# Patient Record
Sex: Female | Born: 1965 | Race: Black or African American | Hispanic: No | Smoking: Never smoker
Health system: Southern US, Community
[De-identification: ages and names within clinical notes are randomized; demographics above are authoritative.]

## PROBLEM LIST (undated history)

## (undated) DIAGNOSIS — R7303 Prediabetes: Secondary | ICD-10-CM

## (undated) DIAGNOSIS — E7800 Pure hypercholesterolemia, unspecified: Secondary | ICD-10-CM

## (undated) DIAGNOSIS — I10 Essential (primary) hypertension: Secondary | ICD-10-CM

## (undated) DIAGNOSIS — E119 Type 2 diabetes mellitus without complications: Secondary | ICD-10-CM

---

## 2016-03-09 ENCOUNTER — Emergency Department
Admission: EM | Admit: 2016-03-09 | Discharge: 2016-03-10 | Disposition: A | Discharge status: Home / Self Care | Payer: Self-pay | Attending: Emergency Medicine | Admitting: Emergency Medicine

## 2016-03-09 ENCOUNTER — Emergency Department: Payer: Self-pay

## 2016-03-09 ENCOUNTER — Encounter: Payer: Self-pay | Admitting: Emergency Medicine

## 2016-03-09 DIAGNOSIS — R1032 Left lower quadrant pain: Secondary | ICD-10-CM

## 2016-03-09 DIAGNOSIS — E876 Hypokalemia: Secondary | ICD-10-CM

## 2016-03-09 DIAGNOSIS — D259 Leiomyoma of uterus, unspecified: Secondary | ICD-10-CM

## 2016-03-09 LAB — COMPREHENSIVE METABOLIC PANEL
ALK PHOS: 40 U/L (ref 38–126)
ALT: 23 U/L (ref 14–54)
AST: 20 U/L (ref 15–41)
Albumin: 4.1 g/dL (ref 3.5–5.0)
Anion gap: 7 (ref 5–15)
BUN: 17 mg/dL (ref 6–20)
CHLORIDE: 102 mmol/L (ref 101–111)
CO2: 29 mmol/L (ref 22–32)
CREATININE: 0.77 mg/dL (ref 0.44–1.00)
Calcium: 9.1 mg/dL (ref 8.9–10.3)
GFR calc Af Amer: >60 mL/min (ref >60)
Glucose, Bld: 140 mg/dL — ABNORMAL HIGH (ref 65–99)
Potassium: 3.1 mmol/L — ABNORMAL LOW (ref 3.5–5.1)
Sodium: 138 mmol/L (ref 135–145)
Total Bilirubin: 0.2 mg/dL — ABNORMAL LOW (ref 0.3–1.2)
Total Protein: 7.6 g/dL (ref 6.5–8.1)

## 2016-03-09 LAB — URINALYSIS, COMPLETE (UACMP) WITH MICROSCOPIC
Bacteria, UA: NONE SEEN
Bilirubin Urine: NEGATIVE
Glucose, UA: NEGATIVE mg/dL
Hgb urine dipstick: NEGATIVE
Ketones, ur: NEGATIVE mg/dL
LEUKOCYTES UA: NEGATIVE
Nitrite: NEGATIVE
PH: 7 (ref 5.0–8.0)
Protein, ur: NEGATIVE mg/dL
SPECIFIC GRAVITY, URINE: 1.015 (ref 1.005–1.030)

## 2016-03-09 LAB — CBC
HCT: 35.4 % (ref 35.0–47.0)
Hemoglobin: 12.6 g/dL (ref 12.0–16.0)
MCH: 29.7 pg (ref 26.0–34.0)
MCHC: 35.5 g/dL (ref 32.0–36.0)
MCV: 83.7 fL (ref 80.0–100.0)
PLATELETS: 330 10*3/uL (ref 150–440)
RBC: 4.23 MIL/uL (ref 3.80–5.20)
RDW: 13.1 % (ref 11.5–14.5)
WBC: 9.8 10*3/uL (ref 3.6–11.0)

## 2016-03-09 LAB — LIPASE, BLOOD: LIPASE: 25 U/L (ref 11–51)

## 2016-03-09 MED ORDER — ONDANSETRON 4 MG PO TBDP
4.0000 mg | ORAL_TABLET | Freq: Once | ORAL | Status: AC
  Administered 2016-03-09: 4 mg via ORAL
  Filled 2016-03-09: qty 1

## 2016-03-09 MED ORDER — KETOROLAC TROMETHAMINE 60 MG/2ML IM SOLN
60.0000 mg | Freq: Once | INTRAMUSCULAR | Status: AC
  Administered 2016-03-09: 60 mg via INTRAMUSCULAR
  Filled 2016-03-09: qty 2

## 2016-03-09 MED ORDER — POTASSIUM CHLORIDE CRYS ER 20 MEQ PO TBCR
40.0000 meq | EXTENDED_RELEASE_TABLET | Freq: Once | ORAL | Status: AC
  Administered 2016-03-09: 40 meq via ORAL
  Filled 2016-03-09: qty 2

## 2016-03-09 MED ORDER — HYDROCODONE-ACETAMINOPHEN 5-325 MG PO TABS
1.0000 | ORAL_TABLET | Freq: Once | ORAL | Status: AC
  Administered 2016-03-09: 1 via ORAL
  Filled 2016-03-09: qty 1

## 2016-03-09 NOTE — ED Provider Notes (Signed)
[facility] Emergency Department Provider Note   ____________________________________________   First MD Initiated Contact with Patient 03/09/16 2321     (approximate)  I have reviewed the triage vital signs and the nursing notes.   HISTORY  Chief Complaint Abdominal Pain    HPI Chloe Alvarado is a 50 y.o. female who presents to the ED from home with a chief complaint of abdominal pain. Patient reports onset of left lower quadrant abdominal pain 2 days ago. Felt like she was constipated from the big meal and took a laxative with good relief of her symptoms. Pain returned yesterday morning. Describes pain as sharp, nonradiating and not associated with fever, chills, chest pain, shortness of breath, nausea, vomiting. Complains of some dysuria. Denies recent travel, trauma or antibiotic use. Nothing makes her symptoms better or worse. No history of pelvic pathology. Denies vaginal bleeding or discharge. History of kidney stones but states this does not feel similar.   Past medical history Kidney stones  There are no active problems to display for this patient.   Past Surgical History:  Procedure Laterality Date  . CESAREAN SECTION      Prior to Admission medications   Not on File    Allergies Patient has no known allergies.  Family history Aunt with ovarian cysts  Social History Social History  Substance Use Topics  . Smoking status: Never Smoker  . Smokeless tobacco: Never Used  . Alcohol use No    Review of Systems  Constitutional: No fever/chills. Eyes: No visual changes. ENT: No sore throat. Cardiovascular: Denies chest pain. Respiratory: Denies shortness of breath. Gastrointestinal: Positive for abdominal pain.  No nausea, no vomiting.  No diarrhea.  No constipation. Genitourinary: Positive for dysuria. Musculoskeletal: Negative for back pain. Skin: Negative for rash. Neurological: Negative for headaches, focal weakness or  numbness.  10-point ROS otherwise negative.  ____________________________________________   PHYSICAL EXAM:  VITAL SIGNS: ED Triage Vitals  Enc Vitals Group     BP 03/09/16 2106 137/90     Pulse Rate 03/09/16 2106 76     Resp 03/09/16 2106 20     Temp 03/09/16 2106 98 F (36.7 C)     Temp Source 03/09/16 2106 Oral     SpO2 03/09/16 2106 100 %     Weight 03/09/16 2103 223 lb 5.2 oz (101.3 kg)     Height 03/09/16 2103 5\' 3"  (1.6 m)     Head Circumference --      Peak Flow --      Pain Score 03/09/16 2103 9     Pain Loc --      Pain Edu? --      Excl. in [place]? --     Constitutional: Alert and oriented. Well appearing and in no acute distress. Eyes: Conjunctivae are normal. PERRL. EOMI. Head: Atraumatic. Nose: No congestion/rhinnorhea. Mouth/Throat: Mucous membranes are moist.  Oropharynx non-erythematous. Neck: No stridor.   Cardiovascular: Normal rate, regular rhythm. Grossly normal heart sounds.  Good peripheral circulation. Respiratory: Normal respiratory effort.  No retractions. Lungs CTAB. Gastrointestinal: Obese. Soft and mildly tender to palpation left lower quadrant and suprapubic area without rebound or guarding. No distention. No abdominal bruits. No CVA tenderness. Musculoskeletal: No lower extremity tenderness nor edema.  No joint effusions. Neurologic:  Normal speech and language. No gross focal neurologic deficits are appreciated. No gait instability. Skin:  Skin is warm, dry and intact. No rash noted. Psychiatric: Mood and affect are normal. Speech and behavior are  normal.  ____________________________________________   LABS (all labs ordered are listed, but only abnormal results are displayed)  Labs Reviewed  COMPREHENSIVE METABOLIC PANEL - Abnormal; Notable for the following:       Result Value   Potassium 3.1 (*)    Glucose, Bld 140 (*)    Total Bilirubin 0.2 (*)    All other components within normal limits  URINALYSIS, COMPLETE (UACMP) WITH  MICROSCOPIC - Abnormal; Notable for the following:    Color, Urine YELLOW (*)    APPearance CLEAR (*)    Squamous Epithelial / LPF 0-5 (*)    All other components within normal limits  LIPASE, BLOOD  CBC  POC URINE PREG, ED   ____________________________________________  EKG  None ____________________________________________  RADIOLOGY  CT renal stone study interpreted per Dr. Alroy Dust: 1. Uncomplicated colonic diverticulosis.  2. Probable uterine fibroid in the anterior midline.  3. Small fat containing umbilical hernia.   ____________________________________________   PROCEDURES  Procedure(s) performed: None  Procedures  Critical Care performed: No  ____________________________________________   INITIAL IMPRESSION / ASSESSMENT AND PLAN / ED COURSE  Pertinent labs & imaging results that were available during my care of the patient were reviewed by me and considered in my medical decision making (see chart for details).  50 year old female who presents with left lower quadrant abdominal pain x 2 days. Laboratory and urinalysis results are unremarkable except for mild hypokalemia. Differential includes diverticulitis, kidney stone, ovarian torsion less likely. Will administer analgesia and proceed with CT renal colic study.  Clinical Course as of Mar 10 101  Tue Mar 10, 2016  0100 Patient improved. Updated her of CT imaging results. She will follow-up with GYN for further evaluation of fibroids. Strict return precautions given. Patient verbalizes understanding and agrees with plan of care.  [JS]    Clinical Course User Index [JS] Paulette Blanch, MD     ____________________________________________   FINAL CLINICAL IMPRESSION(S) / ED DIAGNOSES  Final diagnoses:  Left lower quadrant pain  Hypokalemia  Uterine leiomyoma, unspecified location      NEW MEDICATIONS STARTED DURING THIS VISIT:  New Prescriptions   No medications on file     Note:  This  document was prepared using Dragon voice recognition software and may include unintentional dictation errors.    Paulette Blanch, MD 03/10/16 [phone]

## 2016-03-09 NOTE — ED Triage Notes (Signed)
Pt to triage via w/c with no distress noted; pt reports left lower abd pain since yesterday with no accomp symptoms; denies hx of same

## 2016-03-10 MED ORDER — HYDROCODONE-ACETAMINOPHEN 5-325 MG PO TABS: 1.0000 | ORAL_TABLET | Freq: Four times a day (QID) | ORAL | 0 refills | Status: DC | PRN

## 2016-03-10 MED ORDER — IBUPROFEN 800 MG PO TABS: 800.0000 mg | ORAL_TABLET | Freq: Three times a day (TID) | ORAL | 0 refills | Status: AC | PRN

## 2016-03-10 NOTE — Discharge Instructions (Signed)
1. You may take pain medicines as needed (Motrin/Norco #15) disease. 2. Return to the ER for worsening symptoms, persistent vomiting, difficulty breathing or other concerns.

## 2016-03-20 ENCOUNTER — Other Ambulatory Visit: Payer: Self-pay | Admitting: Obstetrics and Gynecology

## 2016-03-20 DIAGNOSIS — R1032 Left lower quadrant pain: Secondary | ICD-10-CM

## 2016-03-24 ENCOUNTER — Ambulatory Visit
Admission: RE | Admit: 2016-03-24 | Discharge: 2016-03-24 | Disposition: A | Discharge status: Home / Self Care | Payer: Self-pay | Source: Ambulatory Visit | Attending: Obstetrics and Gynecology | Admitting: Obstetrics and Gynecology

## 2016-03-24 DIAGNOSIS — R1032 Left lower quadrant pain: Secondary | ICD-10-CM

## 2016-03-24 DIAGNOSIS — D259 Leiomyoma of uterus, unspecified: Secondary | ICD-10-CM | POA: Insufficient documentation

## 2016-05-24 ENCOUNTER — Encounter: Payer: Self-pay | Admitting: Emergency Medicine

## 2016-05-24 ENCOUNTER — Inpatient Hospital Stay
Admission: EM | Admit: 2016-05-24 | Discharge: 2016-05-26 | DRG: 638 | Disposition: A | Discharge status: Home / Self Care | Payer: Self-pay | Attending: Internal Medicine | Admitting: Internal Medicine

## 2016-05-24 DIAGNOSIS — E1165 Type 2 diabetes mellitus with hyperglycemia: Principal | ICD-10-CM | POA: Diagnosis present

## 2016-05-24 DIAGNOSIS — Z79899 Other long term (current) drug therapy: Secondary | ICD-10-CM

## 2016-05-24 DIAGNOSIS — R739 Hyperglycemia, unspecified: Secondary | ICD-10-CM | POA: Diagnosis present

## 2016-05-24 DIAGNOSIS — Z8249 Family history of ischemic heart disease and other diseases of the circulatory system: Secondary | ICD-10-CM

## 2016-05-24 DIAGNOSIS — E785 Hyperlipidemia, unspecified: Secondary | ICD-10-CM | POA: Diagnosis present

## 2016-05-24 DIAGNOSIS — E860 Dehydration: Secondary | ICD-10-CM | POA: Diagnosis present

## 2016-05-24 DIAGNOSIS — I10 Essential (primary) hypertension: Secondary | ICD-10-CM | POA: Diagnosis present

## 2016-05-24 DIAGNOSIS — E7800 Pure hypercholesterolemia, unspecified: Secondary | ICD-10-CM | POA: Diagnosis present

## 2016-05-24 DIAGNOSIS — E871 Hypo-osmolality and hyponatremia: Secondary | ICD-10-CM | POA: Diagnosis present

## 2016-05-24 HISTORY — DX: Pure hypercholesterolemia, unspecified: E78.00

## 2016-05-24 HISTORY — DX: Essential (primary) hypertension: I10

## 2016-05-24 HISTORY — DX: Prediabetes: R73.03

## 2016-05-24 LAB — CBC
HEMATOCRIT: 39.6 % (ref 35.0–47.0)
HEMOGLOBIN: 14.3 g/dL (ref 12.0–16.0)
MCH: 30.4 pg (ref 26.0–34.0)
MCHC: 36.1 g/dL — AB (ref 32.0–36.0)
MCV: 84.3 fL (ref 80.0–100.0)
Platelets: 338 10*3/uL (ref 150–440)
RBC: 4.7 MIL/uL (ref 3.80–5.20)
RDW: 13.2 % (ref 11.5–14.5)
WBC: 8.6 10*3/uL (ref 3.6–11.0)

## 2016-05-24 LAB — BASIC METABOLIC PANEL
ANION GAP: 15 (ref 5–15)
BUN: 26 mg/dL — AB (ref 6–20)
CHLORIDE: 89 mmol/L — AB (ref 101–111)
CO2: 24 mmol/L (ref 22–32)
Calcium: 10 mg/dL (ref 8.9–10.3)
Creatinine, Ser: 1.16 mg/dL — ABNORMAL HIGH (ref 0.44–1.00)
GFR calc Af Amer: >60 mL/min (ref >60)
GFR, EST NON AFRICAN AMERICAN: 54 mL/min — AB (ref >60)
GLUCOSE: 772 mg/dL — AB (ref 65–99)
Potassium: 4.1 mmol/L (ref 3.5–5.1)
Sodium: 128 mmol/L — ABNORMAL LOW (ref 135–145)

## 2016-05-24 LAB — GLUCOSE, CAPILLARY
GLUCOSE-CAPILLARY: 547 mg/dL — AB (ref 65–99)
GLUCOSE-CAPILLARY: 497 mg/dL — AB (ref 65–99)
GLUCOSE-CAPILLARY: 402 mg/dL — AB (ref 65–99)
Glucose-Capillary: >600 mg/dL (ref 65–99)

## 2016-05-24 MED ORDER — INSULIN ASPART 100 UNIT/ML ~~LOC~~ SOLN
10.0000 [IU] | Freq: Once | SUBCUTANEOUS | Status: AC
  Administered 2016-05-24: 10 [IU] via INTRAVENOUS
  Filled 2016-05-24: qty 10

## 2016-05-24 MED ORDER — SODIUM CHLORIDE 0.9 % IV SOLN
Freq: Once | INTRAVENOUS | Status: AC
  Administered 2016-05-24: via INTRAVENOUS

## 2016-05-24 MED ORDER — SODIUM CHLORIDE 0.9 % IV BOLUS (SEPSIS)
1000.0000 mL | Freq: Once | INTRAVENOUS | Status: AC
  Administered 2016-05-24: 1000 mL via INTRAVENOUS

## 2016-05-24 NOTE — ED Provider Notes (Signed)
[facility] Emergency Department Provider Note   ____________________________________________   I have reviewed the triage vital signs and the nursing notes.   HISTORY  Chief Complaint Weakness and Dental Pain   History limited by: Not Limited   HPI Chloe Alvarado is a 51 y.o. female who presents to the emergency department today because of concerns for weakness and some vision changes. Patient states that the symptoms are getting worse for the past couple of weeks but have been particularly bad the past 3 days. The patient states that she has a history of borderline diabetes but has never been treated for. She does have family history of diabetes.   Past Medical History:  Diagnosis Date  . Borderline diabetic   . High cholesterol   . Hypertension     There are no active problems to display for this patient.   Past Surgical History:  Procedure Laterality Date  . CESAREAN SECTION      Prior to Admission medications   Medication Sig Start Date End Date Taking? Authorizing Provider  HYDROcodone-acetaminophen (NORCO) 5-325 MG tablet Take 1 tablet by mouth every 6 (six) hours as needed for moderate pain. 03/10/16   Paulette Blanch, MD  ibuprofen (ADVIL,MOTRIN) 800 MG tablet Take 1 tablet (800 mg total) by mouth every 8 (eight) hours as needed for moderate pain. 03/10/16   Paulette Blanch, MD    Allergies Patient has no known allergies.  History reviewed. No pertinent family history.  Social History Social History  Substance Use Topics  . Smoking status: Never Smoker  . Smokeless tobacco: Never Used  . Alcohol use No    Review of Systems  Constitutional: Negative for fever. Positive for weakness. Cardiovascular: Negative for chest pain. Respiratory: Negative for shortness of breath. Gastrointestinal: Negative for abdominal pain, vomiting and diarrhea. Genitourinary: Negative for dysuria. Musculoskeletal: Negative for back pain. Skin:  Negative for rash. Neurological: Negative for headaches, focal weakness or numbness.  10-point ROS otherwise negative.  ____________________________________________   PHYSICAL EXAM:   Constitutional: Alert and oriented. Well appearing and in no distress. Eyes: Conjunctivae are normal. Normal extraocular movements. ENT   Head: Normocephalic and atraumatic.   Nose: No congestion/rhinnorhea.   Mouth/Throat: Mucous membranes are moist.   Neck: No stridor. Hematological/Lymphatic/Immunilogical: No cervical lymphadenopathy. Cardiovascular: Normal rate, regular rhythm.  No murmurs, rubs, or gallops.  Respiratory: Normal respiratory effort without tachypnea nor retractions. Breath sounds are clear and equal bilaterally. No wheezes/rales/rhonchi. Gastrointestinal: Soft and non tender. No rebound. No guarding.  Genitourinary: Deferred Musculoskeletal: Normal range of motion in all extremities. No lower extremity edema. Neurologic:  Normal speech and language. No gross focal neurologic deficits are appreciated.  Skin:  Skin is warm, dry and intact. No rash noted. Psychiatric: Mood and affect are normal. Speech and behavior are normal. Patient exhibits appropriate insight and judgment.  ____________________________________________    LABS (pertinent positives/negatives)  Labs Reviewed  BASIC METABOLIC PANEL - Abnormal; Notable for the following:       Result Value   Sodium 128 (*)    Chloride 89 (*)    Glucose, Bld 772 (*)    BUN 26 (*)    Creatinine, Ser 1.16 (*)    GFR calc non Af Amer 54 (*)    All other components within normal limits  CBC - Abnormal; Notable for the following:    MCHC 36.1 (*)    All other components within normal limits  GLUCOSE, CAPILLARY - Abnormal; Notable for the  following:    Glucose-Capillary >600 (*)    All other components within normal limits  GLUCOSE, CAPILLARY - Abnormal; Notable for the following:    Glucose-Capillary 547 (*)     All other components within normal limits  GLUCOSE, CAPILLARY - Abnormal; Notable for the following:    Glucose-Capillary 497 (*)    All other components within normal limits  GLUCOSE, CAPILLARY - Abnormal; Notable for the following:    Glucose-Capillary 402 (*)    All other components within normal limits  URINALYSIS, COMPLETE (UACMP) WITH MICROSCOPIC  CBG MONITORING, ED     ____________________________________________   EKG  I, Nance Pear, attending physician, personally viewed and interpreted this EKG  EKG Time: 1752 Rate: 78 Rhythm: normal sinus rhythm Axis: left axis deviation Intervals: qtc 440 QRS: narrow, q waves V1 ST changes: no st elevation Impression: abnormal ekg   ____________________________________________    RADIOLOGY  None  ____________________________________________   PROCEDURES  Procedures  CRITICAL CARE Performed by: Nance Pear   Total critical care time: 35 minutes  Critical care time was exclusive of separately billable procedures and treating other patients.  Critical care was necessary to treat or prevent imminent or life-threatening deterioration.  Critical care was time spent personally by me on the following activities: development of treatment plan with patient and/or surrogate as well as nursing, discussions with consultants, evaluation of patient's response to treatment, examination of patient, obtaining history from patient or surrogate, ordering and performing treatments and interventions, ordering and review of laboratory studies, ordering and review of radiographic studies, pulse oximetry and re-evaluation of patient's condition.  ____________________________________________   INITIAL IMPRESSION / ASSESSMENT AND PLAN / ED COURSE  Pertinent labs & imaging results that were available during my care of the patient were reviewed by me and considered in my medical decision making (see chart for details).  Patient  presented to the emergency department today because of concerns for feeling unwell. Blood sugars was found to be severely elevated. Patient does not carry the diagnosis of diabetes. Patient was given IV fluids and insulin here in the emergency department. Sugars continued to remain high. Will be admitted to the hospitalist service for further workup and evaluation.  ____________________________________________   FINAL CLINICAL IMPRESSION(S) / ED DIAGNOSES  Final diagnoses:  Type 2 diabetes mellitus with hyperglycemia, without long-term current use of insulin ([place])     Note: This dictation was prepared with Dragon dictation. Any transcriptional errors that result from this process are unintentional     Nance Pear, MD 05/24/16 2329

## 2016-05-24 NOTE — ED Triage Notes (Signed)
Pt presents to ED with c/o dental pain since Thursday. Pt states "I think it has gotten bad enough to poison my system". Pt also c/o generalized weakness at this time, c/o gums bleeding after brushing her teeth at this time.

## 2016-05-25 LAB — COMPREHENSIVE METABOLIC PANEL
ALT: 25 U/L (ref 14–54)
AST: 19 U/L (ref 15–41)
Albumin: 3.7 g/dL (ref 3.5–5.0)
Alkaline Phosphatase: 37 U/L — ABNORMAL LOW (ref 38–126)
Anion gap: 8 (ref 5–15)
BUN: 21 mg/dL — AB (ref 6–20)
CHLORIDE: 103 mmol/L (ref 101–111)
CO2: 23 mmol/L (ref 22–32)
Calcium: 8.8 mg/dL — ABNORMAL LOW (ref 8.9–10.3)
Creatinine, Ser: 0.82 mg/dL (ref 0.44–1.00)
GFR calc Af Amer: >60 mL/min (ref >60)
GFR calc non Af Amer: >60 mL/min (ref >60)
Glucose, Bld: 310 mg/dL — ABNORMAL HIGH (ref 65–99)
POTASSIUM: 3.3 mmol/L — AB (ref 3.5–5.1)
SODIUM: 134 mmol/L — AB (ref 135–145)
Total Bilirubin: 0.9 mg/dL (ref 0.3–1.2)
Total Protein: 7.1 g/dL (ref 6.5–8.1)

## 2016-05-25 LAB — URINALYSIS, COMPLETE (UACMP) WITH MICROSCOPIC
Bacteria, UA: NONE SEEN
Bilirubin Urine: NEGATIVE
Glucose, UA: >=500 mg/dL — AB
Hgb urine dipstick: NEGATIVE
KETONES UR: 80 mg/dL — AB
Leukocytes, UA: NEGATIVE
Nitrite: NEGATIVE
PH: 5 (ref 5.0–8.0)
Protein, ur: NEGATIVE mg/dL
SPECIFIC GRAVITY, URINE: 1.028 (ref 1.005–1.030)

## 2016-05-25 LAB — GLUCOSE, CAPILLARY
GLUCOSE-CAPILLARY: 300 mg/dL — AB (ref 65–99)
GLUCOSE-CAPILLARY: 364 mg/dL — AB (ref 65–99)
GLUCOSE-CAPILLARY: 284 mg/dL — AB (ref 65–99)
GLUCOSE-CAPILLARY: 289 mg/dL — AB (ref 65–99)
GLUCOSE-CAPILLARY: 331 mg/dL — AB (ref 65–99)
Glucose-Capillary: 362 mg/dL — ABNORMAL HIGH (ref 65–99)
Glucose-Capillary: 375 mg/dL — ABNORMAL HIGH (ref 65–99)

## 2016-05-25 LAB — CBC
HCT: 35.4 % (ref 35.0–47.0)
Hemoglobin: 12.7 g/dL (ref 12.0–16.0)
MCH: 29.9 pg (ref 26.0–34.0)
MCHC: 35.9 g/dL (ref 32.0–36.0)
MCV: 83.3 fL (ref 80.0–100.0)
PLATELETS: 296 10*3/uL (ref 150–440)
RBC: 4.24 MIL/uL (ref 3.80–5.20)
RDW: 13.1 % (ref 11.5–14.5)
WBC: 7.4 10*3/uL (ref 3.6–11.0)

## 2016-05-25 LAB — MAGNESIUM: MAGNESIUM: 2.1 mg/dL (ref 1.7–2.4)

## 2016-05-25 LAB — PHOSPHORUS: PHOSPHORUS: 4 mg/dL (ref 2.5–4.6)

## 2016-05-25 MED ORDER — ACETAMINOPHEN 325 MG PO TABS: 650.0000 mg | ORAL_TABLET | Freq: Four times a day (QID) | ORAL | Status: DC | PRN

## 2016-05-25 MED ORDER — IPRATROPIUM BROMIDE 0.02 % IN SOLN: 0.5000 mg | Freq: Four times a day (QID) | RESPIRATORY_TRACT | Status: DC | PRN

## 2016-05-25 MED ORDER — ONDANSETRON HCL 4 MG/2ML IJ SOLN: 4.0000 mg | Freq: Four times a day (QID) | INTRAMUSCULAR | Status: DC | PRN

## 2016-05-25 MED ORDER — SENNOSIDES-DOCUSATE SODIUM 8.6-50 MG PO TABS: 1.0000 | ORAL_TABLET | Freq: Every evening | ORAL | Status: DC | PRN

## 2016-05-25 MED ORDER — LISINOPRIL 20 MG PO TABS
20.0000 mg | ORAL_TABLET | Freq: Every day | ORAL | Status: DC
  Administered 2016-05-25 – 2016-05-26 (×2): 20 mg via ORAL
  Filled 2016-05-25 (×2): qty 1

## 2016-05-25 MED ORDER — PRAVASTATIN SODIUM 20 MG PO TABS
20.0000 mg | ORAL_TABLET | Freq: Every day | ORAL | Status: DC
Start: 2016-05-25 — End: 2016-05-26
  Administered 2016-05-25 – 2016-05-26 (×2): 20 mg via ORAL
  Filled 2016-05-25 (×2): qty 1

## 2016-05-25 MED ORDER — OXYCODONE HCL 5 MG PO TABS: 5.0000 mg | ORAL_TABLET | ORAL | Status: DC | PRN

## 2016-05-25 MED ORDER — ALBUTEROL SULFATE (2.5 MG/3ML) 0.083% IN NEBU: 2.5000 mg | INHALATION_SOLUTION | Freq: Four times a day (QID) | RESPIRATORY_TRACT | Status: DC | PRN

## 2016-05-25 MED ORDER — POTASSIUM CHLORIDE 20 MEQ PO PACK
40.0000 meq | PACK | Freq: Once | ORAL | Status: AC
Start: 2016-05-25 — End: 2016-05-25
  Administered 2016-05-25: 40 meq via ORAL
  Filled 2016-05-25: qty 2

## 2016-05-25 MED ORDER — ACETAMINOPHEN 650 MG RE SUPP: 650.0000 mg | Freq: Four times a day (QID) | RECTAL | Status: DC | PRN

## 2016-05-25 MED ORDER — BISACODYL 5 MG PO TBEC
5.0000 mg | DELAYED_RELEASE_TABLET | Freq: Every day | ORAL | Status: DC | PRN
  Filled 2016-05-25: qty 1

## 2016-05-25 MED ORDER — METFORMIN HCL 500 MG PO TABS: 500.0000 mg | ORAL_TABLET | Freq: Two times a day (BID) | ORAL | 0 refills | Status: DC

## 2016-05-25 MED ORDER — LIVING WELL WITH DIABETES BOOK
Freq: Once | Status: AC
  Administered 2016-05-25: 13:00:00
  Filled 2016-05-25: qty 1

## 2016-05-25 MED ORDER — SODIUM CHLORIDE 0.9 % IV SOLN
INTRAVENOUS | Status: DC
  Administered 2016-05-25: 01:00:00 via INTRAVENOUS

## 2016-05-25 MED ORDER — METFORMIN HCL 500 MG PO TABS
500.0000 mg | ORAL_TABLET | Freq: Two times a day (BID) | ORAL | Status: DC
  Administered 2016-05-25 – 2016-05-26 (×3): 500 mg via ORAL
  Filled 2016-05-25 (×3): qty 1

## 2016-05-25 MED ORDER — ONDANSETRON HCL 4 MG PO TABS: 4.0000 mg | ORAL_TABLET | Freq: Four times a day (QID) | ORAL | Status: DC | PRN

## 2016-05-25 MED ORDER — ENOXAPARIN SODIUM 40 MG/0.4ML ~~LOC~~ SOLN
40.0000 mg | SUBCUTANEOUS | Status: DC
  Administered 2016-05-25 – 2016-05-26 (×2): 40 mg via SUBCUTANEOUS
  Filled 2016-05-25 (×2): qty 0.4

## 2016-05-25 MED ORDER — MAGNESIUM CITRATE PO SOLN: 1.0000 | Freq: Once | ORAL | Status: DC | PRN

## 2016-05-25 MED ORDER — INSULIN ASPART 100 UNIT/ML ~~LOC~~ SOLN
0.0000 [IU] | SUBCUTANEOUS | Status: DC
  Administered 2016-05-25: 20 [IU] via SUBCUTANEOUS
  Administered 2016-05-25: 05:00:00 11 [IU] via SUBCUTANEOUS
  Administered 2016-05-25: 20:00:00 15 [IU] via SUBCUTANEOUS
  Administered 2016-05-25 (×2): 11 [IU] via SUBCUTANEOUS
  Administered 2016-05-25 – 2016-05-26 (×2): 20 [IU] via SUBCUTANEOUS
  Administered 2016-05-26: 08:00:00 11 [IU] via SUBCUTANEOUS
  Administered 2016-05-26: 04:00:00 7 [IU] via SUBCUTANEOUS
  Filled 2016-05-25: qty 20
  Filled 2016-05-25: qty 11
  Filled 2016-05-25: qty 15
  Filled 2016-05-25: qty 20
  Filled 2016-05-25: qty 11
  Filled 2016-05-25: qty 7
  Filled 2016-05-25: qty 20
  Filled 2016-05-25 (×2): qty 11

## 2016-05-25 NOTE — H&P (Addendum)
[facility] [place] NAME: Chloe Alvarado    MR#:  [id number]  DATE OF BIRTH:  05-Nov-1965  DATE OF ADMISSION:  05/24/2016  PRIMARY CARE PHYSICIAN: Pcp Not In System   REQUESTING/REFERRING PHYSICIAN:   CHIEF COMPLAINT:   Chief Complaint  Patient presents with  . Weakness  . Dental Pain    HISTORY OF PRESENT ILLNESS: Chloe Alvarado  is a 51 y.o. female with a known history of Hyperlipidemia, hypertension presented to the emergency room with generalized weakness and tiredness. Patient off rate has been drinking a lot of fluid and also feels very hungry. Has polyuria and polyphagia. Patient also has some blurry vision last couple of weeks. She was evaluated in the emergency room her blood sugar was high more than 700. Patient was given IV fluids and she was evaluated. Laboratory workup showed patient not to be in diabetic ketoacidosis. No complaints of any chest pain, shortness of breath. No fever or chills and cough. Hospitalist service was consulted for further care of the patient with new onset diabetes mellitus.  PAST MEDICAL HISTORY:   Past Medical History:  Diagnosis Date  . Borderline diabetic   . High cholesterol   . Hypertension     PAST SURGICAL HISTORY: Past Surgical History:  Procedure Laterality Date  . CESAREAN SECTION      SOCIAL HISTORY:  Social History  Substance Use Topics  . Smoking status: Never Smoker  . Smokeless tobacco: Never Used  . Alcohol use No    FAMILY HISTORY: History reviewed. No pertinent family history.  Mother : deceased Had hypertension Father : deceased Had prostate cancer  DRUG ALLERGIES: No Known Allergies  REVIEW OF SYSTEMS:   CONSTITUTIONAL: No fever, has weakness.  EYES: has blurry vision EARS, NOSE, AND THROAT: No tinnitus or ear pain.  RESPIRATORY: No cough, shortness of breath, wheezing or hemoptysis.  CARDIOVASCULAR: No chest pain, orthopnea, edema.  GASTROINTESTINAL: No  nausea, vomiting, diarrhea or abdominal pain.  GENITOURINARY: Has polyuria, no hematuria.  ENDOCRINE: Has polyuria, nocturia,  HEMATOLOGY: No anemia, easy bruising or bleeding SKIN: No rash or lesion. MUSCULOSKELETAL: No joint pain or arthritis.   NEUROLOGIC: No tingling, numbness, weakness.  PSYCHIATRY: No anxiety or depression.   MEDICATIONS AT HOME:  Prior to Admission medications   Medication Sig Start Date End Date Taking? Authorizing Provider  lisinopril (PRINIVIL,ZESTRIL) 20 MG tablet Take 20 mg by mouth daily.   Yes Historical Provider, MD  pravastatin (PRAVACHOL) 20 MG tablet Take 20 mg by mouth daily.   Yes Historical Provider, MD  HYDROcodone-acetaminophen (NORCO) 5-325 MG tablet Take 1 tablet by mouth every 6 (six) hours as needed for moderate pain. Patient not taking: Reported on 05/24/2016 03/10/16   Paulette Blanch, MD  ibuprofen (ADVIL,MOTRIN) 800 MG tablet Take 1 tablet (800 mg total) by mouth every 8 (eight) hours as needed for moderate pain. Patient not taking: Reported on 05/24/2016 03/10/16   Paulette Blanch, MD      PHYSICAL EXAMINATION:   VITAL SIGNS: Blood pressure 114/80, pulse 74, temperature 98.3 F (36.8 C), temperature source Oral, resp. rate 20, height 5\' 3"  (1.6 m), weight 97.1 kg (214 lb), SpO2 98 %.  GENERAL:  51 y.o.-year-old patient lying in the bed with no acute distress.  EYES: Pupils equal, round, reactive to light and accommodation. No scleral icterus. Extraocular muscles intact.  HEENT: Head atraumatic, normocephalic. Oropharynx and nasopharynx clear.  NECK:  Supple, no jugular venous distention.  No thyroid enlargement, no tenderness.  LUNGS: Normal breath sounds bilaterally, no wheezing, rales,rhonchi or crepitation. No use of accessory muscles of respiration.  CARDIOVASCULAR: S1, S2 normal. No murmurs, rubs, or gallops.  ABDOMEN: Soft, nontender, nondistended. Bowel sounds present. No organomegaly or mass.  EXTREMITIES: No pedal edema, cyanosis, or  clubbing.  NEUROLOGIC: Cranial nerves II through XII are intact. Muscle strength 5/5 in all extremities. Sensation intact. Gait not checked.  PSYCHIATRIC: The patient is alert and oriented x 3.  SKIN: No obvious rash, lesion, or ulcer.   LABORATORY PANEL:   CBC  Recent Labs Lab 05/24/16 1747  WBC 8.6  HGB 14.3  HCT 39.6  PLT 338  MCV 84.3  MCH 30.4  MCHC 36.1*  RDW 13.2   ------------------------------------------------------------------------------------------------------------------  Chemistries   Recent Labs Lab 05/24/16 1747  NA 128*  K 4.1  CL 89*  CO2 24  GLUCOSE 772*  BUN 26*  CREATININE 1.16*  CALCIUM 10.0  MG 2.1   ------------------------------------------------------------------------------------------------------------------ estimated creatinine clearance is 63.7 mL/min (by C-G formula based on SCr of 1.16 mg/dL (H)). ------------------------------------------------------------------------------------------------------------------ No results for input(s): TSH, T4TOTAL, T3FREE, THYROIDAB in the last 72 hours.  Invalid input(s): FREET3   Coagulation profile No results for input(s): INR, PROTIME in the last 168 hours. ------------------------------------------------------------------------------------------------------------------- No results for input(s): DDIMER in the last 72 hours. -------------------------------------------------------------------------------------------------------------------  Cardiac Enzymes No results for input(s): CKMB, TROPONINI, MYOGLOBIN in the last 168 hours.  Invalid input(s): CK ------------------------------------------------------------------------------------------------------------------ Invalid input(s): POCBNP  ---------------------------------------------------------------------------------------------------------------  Urinalysis    Component Value Date/Time   COLORURINE STRAW (A) 05/25/2016 0044    APPEARANCEUR CLEAR (A) 05/25/2016 0044   LABSPEC 1.028 05/25/2016 0044   PHURINE 5.0 05/25/2016 0044   GLUCOSEU >=500 (A) 05/25/2016 0044   HGBUR NEGATIVE 05/25/2016 0044   BILIRUBINUR NEGATIVE 05/25/2016 0044   KETONESUR 80 (A) 05/25/2016 0044   PROTEINUR NEGATIVE 05/25/2016 0044   NITRITE NEGATIVE 05/25/2016 0044   LEUKOCYTESUR NEGATIVE 05/25/2016 0044     RADIOLOGY: No results found.  EKG: Orders placed or performed during the hospital encounter of 05/24/16  . ED EKG  . ED EKG  . EKG 12-Lead    IMPRESSION AND PLAN: 51 year old female patient with history of hypertension, hyperlipidemia presented to the emergency room with generalized weakness. Admitting diagnosis 1. New onset diabetes mellitus 2. Hyponatremia 3. Dehydration 4. Hyperglycemia 5. Hypertension Treatment plan Admit patient to medical floor IV fluid hydration Start patient on oral metformin 500 MG orally twice a day NovoLog sliding scale coverage for control of blood sugars Follow-up sodium level and renal function Diabetic teaching Supportive care.  All the records are reviewed and case discussed with ED provider. Management plans discussed with the patient, family and they are in agreement.  CODE STATUS:FULL CODE    Code Status Orders        Start     Ordered   05/25/16 0030  Full code  Continuous     05/25/16 0029    Code Status History    Date Active Date Inactive Code Status Order ID Comments User Context   This patient has a current code status but no historical code status.       TOTAL TIME TAKING CARE OF THIS PATIENT: 52 minutes.    Saundra Shelling M.D on 05/25/2016 at 2:08 AM  Between 7am to 6pm - Pager - [phone]  After 6pm go to [email or web address] - password [email or web address] [facility]  Office  [phone]  CC: Primary care physician; Pcp  Not In System

## 2016-05-25 NOTE — Progress Notes (Signed)
Admitted for generalized weakens/polyuria and found to have BG 700 and mild hyponatremia, NO AG> Continue IV fluids,started metformin,SSI.due to new diagnosis of DMII,got diabetic teaching,and H a1 c ordered.lab said they sent to [place] and results take more than 24hrs. Continue metformin,Lisinopril ,and HLP med. Possible d/c am ,depending on HBa1c either pill or insulin.pt prefers pills. Time spent;25 min

## 2016-05-25 NOTE — Progress Notes (Signed)
Briefly saw patient this afternoon.  RN reviewing Diabetes booklet with patient.  Patient was smiling and seemed more relaxed.  RN explained to pt. That MD was awaiting results of A1C to determine home medications.  Patient states that she is afraid of needles but will give shots to herself if she has too.  She states that her previous husband had diabetes and she assisted him with checking blood sugars.  Discussed [employer] meter for monitoring.  She is hoping that she will only need pills for diabetes.  Talked with case manager regarding patient's lack of insurance however she is established at a clinic.   Will continue to follow.  Thanks, Adah Perl, RN, BC-ADM Inpatient Diabetes Coordinator Pager [phone] (8a-5p)

## 2016-05-25 NOTE — Progress Notes (Signed)
Inpatient Diabetes Program Recommendations  AACE/ADA: New Consensus Statement on Inpatient Glycemic Control (2015)  Target Ranges:  Prepandial:   less than 140 mg/dL      Peak postprandial:   less than 180 mg/dL (1-2 hours)      Critically ill patients:  140 - 180 mg/dL   Lab Results  Component Value Date   GLUCAP 364 (H) 05/25/2016    Review of Glycemic ControlResults for ZARAI, MALONY (MRN [id number]) as of 05/25/2016 10:18  Ref. Range 05/24/2016 17:54 05/24/2016 20:01 05/24/2016 21:41 05/24/2016 22:56 05/25/2016 01:29 05/25/2016 04:55 05/25/2016 07:53  Glucose-Capillary Latest Ref Range: 65 - 99 mg/dL >600 (HH) 547 (HH) 497 (H) 402 (H) 375 (H) 300 (H) 364 (H)    Diabetes history: New onset diabetes Outpatient Diabetes medications: None Current orders for Inpatient glycemic control:  Metformin 500 mg bid, Novolog resistant q 4 hours  Inpatient Diabetes Program Recommendations:    A1C pending however based on admitting glucose patient will likely need insulin at home.  Consider adding Levemir 20 units q HS while in the hospital.  Could consider NPH at bedtime due to cost at discharge?  Will talk to patient today and coordinate education.  Thanks, Adah Perl, RN, BC-ADM Inpatient Diabetes Coordinator Pager [phone] (8a-5p)

## 2016-05-25 NOTE — Progress Notes (Signed)
Spoke with lab and they state that the A1C is a send out and therefore will not be available for 1-2 days.  Called and discussed with MD.  She states that she will keep patient overnight so that medication needs can continue to be assessed as well and blood sugar monitoring.  Will follow and see patient in the morning 05/26/16.  Called and discussed with RN-Malka.  Thanks, Adah Perl, RN, BC-ADM Inpatient Diabetes Coordinator Pager [phone] (8a-5p)

## 2016-05-25 NOTE — Evaluation (Signed)
Physical Therapy Evaluation Patient Details Name: Chloe Alvarado MRN: [id number] DOB: 1965-09-21 Today's Date: 05/25/2016   History of Present Illness  Chloe Alvarado is a 51 y.o. female with a known history of hyperlipidemia, hypertension presented to the emergency room with generalized weakness and tiredness. Patient has been drinking a lot of fluid and also feels very hungry. Has polyuria and polyphagia. Patient also has some blurry vision last couple of weeks. She was evaluated in the emergency room her blood sugar was high more than 700. Patient was given IV fluids and she was evaluated. Laboratory workup showed patient not to be in diabetic ketoacidosis. No complaints of any chest pain, shortness of breath. No fever or chills and cough. Hospitalist service was consulted for further care of the patient with new onset diabetes mellitus. Pt is now admitted for DM, hyponatremia, hyperglycemia, and dehydration.   Clinical Impression  PT consulted due to generalized weakness upon admission. Pt reports resolution of weakness and return to baseline mobility. Pt is independent with bed mobility, transfers, ambulation, and stairs during evaluation. Single leg balance greater than 10 seconds bilaterally. No deficits identified at this time. PT order will be completed. Please enter new order if status or needs change.     Follow Up Recommendations No PT follow up    Equipment Recommendations  None recommended by PT    Recommendations for Other Services       Precautions / Restrictions Precautions Precautions: None Restrictions Weight Bearing Restrictions: No      Mobility  Bed Mobility Overal bed mobility: Independent             General bed mobility comments: Good speed/sequencing  Transfers Overall transfer level: Independent Equipment used: None             General transfer comment: Good speed, sequencing, and stability with transfers. No deficits  noted  Ambulation/Gait Ambulation/Gait assistance: Independent Ambulation Distance (Feet): 200 Feet Assistive device: None Gait Pattern/deviations: WFL(Within Functional Limits) Gait velocity: WFL Gait velocity interpretation: >2.62 ft/sec, indicative of independent community ambulator General Gait Details: Pt ambulates with normal speed, sequencing, and stability without assistive device. She is able to perform head turns without lateral gait deviation or change in speed. No deficits identified  Stairs Stairs: Yes Stairs assistance: Independent Stair Management: No rails Number of Stairs: 5 General stair comments: Ascend/descend 5 steps with alternating pattern and no rails. No safety concerns idetnfied  Wheelchair Mobility    Modified Rankin (Stroke Patients Only)       Balance Overall balance assessment: Independent (Single leg balance >10 seconds)                                           Pertinent Vitals/Pain Pain Assessment: No/denies pain    Home Living Family/patient expects to be discharged to:: Private residence Living Arrangements: Spouse/significant other Available Help at Discharge: Family Type of Home: Apartment Home Access: Stairs to enter Entrance Stairs-Rails: Psychiatric nurse of Steps: 18 Home Layout: One level Home Equipment: None      Prior Function Level of Independence: Independent         Comments: Fully independent with ADLs/IADLs. Drives. No AD for ambulation. No falls in the last 12 months     Hand Dominance   Dominant Hand: Right    Extremity/Trunk Assessment   Upper Extremity Assessment Upper Extremity Assessment: Overall WFL for  tasks assessed    Lower Extremity Assessment Lower Extremity Assessment: Overall WFL for tasks assessed       Communication   Communication: No difficulties  Cognition Arousal/Alertness: Awake/alert Behavior During Therapy: WFL for tasks  assessed/performed Overall Cognitive Status: Within Functional Limits for tasks assessed                      General Comments      Exercises     Assessment/Plan    PT Assessment Patent does not need any further PT services  PT Problem List         PT Treatment Interventions      PT Goals (Current goals can be found in the Care Plan section)  Acute Rehab PT Goals PT Goal Formulation: All assessment and education complete, DC therapy    Frequency     Barriers to discharge        Co-evaluation               End of Session Equipment Utilized During Treatment: Gait belt Activity Tolerance: Patient tolerated treatment well Patient left: in bed;with call bell/phone within reach Nurse Communication: Other (comment) (Care manager notified no PT needs) PT Visit Diagnosis: Muscle weakness (generalized) (M62.81)         Time: [id number] PT Time Calculation (min) (ACUTE ONLY): 12 min   Charges:   PT Evaluation $PT Eval Low Complexity: 1 Procedure     PT G Codes:        Chloe Alvarado PT, DPT   Chloe Alvarado 05/25/2016, 9:59 AM

## 2016-05-25 NOTE — Progress Notes (Signed)
Inpatient Diabetes Program Recommendations  AACE/ADA: New Consensus Statement on Inpatient Glycemic Control (2015)  Target Ranges:  Prepandial:   less than 140 mg/dL      Peak postprandial:   less than 180 mg/dL (1-2 hours)      Critically ill patients:  140 - 180 mg/dL   Lab Results  Component Value Date   [id number] (H) 05/25/2016    Spoke with patient in depth regarding new diagnosis of diabetes.  She states "I feel depressed" about this new diagnosis.  She works at [employer] and has been working with representative at the clinic to get insurance.  She states that she makes "too little" to qualify for "Obamacare". She has been waiting for Medicaid denial letter so that the clinic can go further to assist her.  Patient see's Dr. Guerry Bruin at the clinic and states she was told in the past that she was borderline.  Awaiting results of A1C.  Explained A1C to patient and that it would give the MD more information regarding her medication needs at discharge.  Also discussed what Type 2 diabetes is.  Patient described symptoms of hyperglycemia for the past few weeks including thirst, frequent urination, and blurred vision.  Told patient that she will likely need insulin at discharge as well.  She states she can get medication from the clinic pharmacy.  Attempted to console patient and encourage her regarding her new diagnosis.  She seems very overwhelmed at this time.  Discussed with RN as well.  She intends to assist patient with insulin teaching with lunch time dose.  Patient appears to need much repetition and encouragement regarding teaching and adjustment to her new diagnosis.  Will text page MD as well.  Thanks, Adah Perl, RN, BC-ADM Inpatient Diabetes Coordinator Pager [phone] (8a-5p)

## 2016-05-25 NOTE — Plan of Care (Signed)
Problem: Food- and Nutrition-Related Knowledge Deficit (NB-1.1) Goal: Nutrition education Formal process to instruct or train a patient/client in a skill or to impart knowledge to help patients/clients voluntarily manage or modify food choices and eating behavior to maintain or improve health. Outcome: Completed/Met Date Met: 05/25/16  RD consulted for nutrition education regarding diabetes.   No results found for: HGBA1C   CBG: 284 to >600 past 24 hrs  Spoke with patient at bedside. Patient reports she typically eats 2 meals per day. Breakfast is usually 3 scrambled eggs with sausage or bacon and fruit or a tomato. Dinner is usually chicken, fish, or [place] (baked or in slow cooker) with greens and rice. She works at [employer] and snacks during the day (may be 2 slices pizza, salad, or fruit). Patient reports she drinks water and Ginger-ale during the day. Patient reports she has already tried Diet Ginger-ale here and will switch to this instead of her regular drink. Patient reports she does not have insurance right now so she does not want to pursue outpatient DM counseling as suggested by this RD.   RD provided "Carbohydrate Counting for People with Diabetes" handout from the Academy of Nutrition and Dietetics. Discussed different food groups and their effects on blood sugar, emphasizing carbohydrate-containing foods. Provided list of carbohydrates and recommended serving sizes of common foods.  RD provided "Using Nutrition Labels: Carbohydrates" handout from the Academy of Nutrition and Dietetics. Discussed how to read a nutrition label including looking at serving size and servings per container. Reviewed that there are 15 grams of carbohydrate in one carbohydrate choice. Encouraged patient to use chart for range of carbohydrate grams per choice when reading nutrition labels.  Discussed importance of controlled and consistent carbohydrate intake throughout the day. Provided examples of  ways to balance meals/snacks and encouraged intake of high-fiber, whole grain complex carbohydrates. Teach back method used.  Expect good compliance.  Body mass index is 37.91 kg/m. Pt meets criteria for Obesity Class II based on current BMI.  Current diet order is Carbohydrate Modified, patient is consuming approximately 100% of meals at this time. Labs and medications reviewed. No further nutrition interventions warranted at this time. RD contact information provided. If additional nutrition issues arise, please re-consult RD.  Willey Blade, MS, RD, LDN Pager: [phone] After Hours Pager: [phone]

## 2016-05-26 LAB — GLUCOSE, CAPILLARY
GLUCOSE-CAPILLARY: 275 mg/dL — AB (ref 65–99)
Glucose-Capillary: 242 mg/dL — ABNORMAL HIGH (ref 65–99)

## 2016-05-26 LAB — HEMOGLOBIN A1C
HEMOGLOBIN A1C: 10.7 % — AB (ref 4.8–5.6)
HEMOGLOBIN A1C: 10.5 % — AB (ref 4.8–5.6)
MEAN PLASMA GLUCOSE: 255 mg/dL
Mean Plasma Glucose: 260 mg/dL

## 2016-05-26 LAB — HIV ANTIBODY (ROUTINE TESTING W REFLEX): HIV Screen 4th Generation wRfx: NONREACTIVE

## 2016-05-26 MED ORDER — METFORMIN HCL 500 MG PO TABS: 1000.0000 mg | ORAL_TABLET | Freq: Two times a day (BID) | ORAL | 1 refills | Status: AC

## 2016-05-26 MED ORDER — GLIMEPIRIDE 2 MG PO TABS: 2.0000 mg | ORAL_TABLET | Freq: Every day | ORAL | 0 refills | Status: AC

## 2016-05-26 NOTE — Progress Notes (Signed)
Pt administered insulin injection to abdomen with verbal talk through.

## 2016-05-26 NOTE — Discharge Instructions (Signed)

## 2016-05-26 NOTE — Progress Notes (Addendum)
Spoke with MD regarding A1C results.  She plans to send patient home on oral agents at this time. Spoke with patient again this morning regarding new diagnosis of diabetes and medications.  She was much more engaged this AM and glad that she is only going to be on pills for diabetes for now.  Reviewed survival skills again including monitoring, normal glucose levels, A1C results, hyperglycemia, Hypoglycemia signs, symptoms and treatment, and the importance of follow-up.   She has appointment with Dr. Guerry Bruin on March 19th, 2018.  Explained to call MD if CBG's consistently greater than 250 mg/dL.  Also discussed metformin effects and side effects including diarrhea.  She states that she has had some diarrhea since starting this medication.  Encouraged her to continue taking that this should improve with time.  Also encouraged her to take with food.  Also discussed  Glimepiride and how it works explaining that it causes the pancreas to put out more insulin.  Discussed hypoglycemia signs and symptoms and that medication should not be taken if patient is unable to eat.  Mrs. Cates verbalized understanding and was appreciative of information.    Thanks,  Adah Perl, RN, BC-ADM Inpatient Diabetes Coordinator Pager [phone] (8a-5p)

## 2016-05-26 NOTE — Discharge Summary (Signed)
Chloe Alvarado, is a 51 y.o. female  DOB 11/10/1965  MRN [id number].  Admission date:  05/24/2016  Admitting Physician  No admitting provider for patient encounter.  Discharge Date:  05/26/2016   Primary MD  Pcp Not In System  Recommendations for primary care physician for things to follow:  Follow up  With PCP from [facility] in 1 month.    Admission Diagnosis  Type 2 diabetes mellitus with hyperglycemia, without long-term current use of insulin ([place]) [E11.65]   Discharge Diagnosis  Type 2 diabetes mellitus with hyperglycemia, without long-term current use of insulin ([place]) [E11.65]   Active Problems:   Hyperglycemia      Past Medical History:  Diagnosis Date  . Borderline diabetic   . High cholesterol   . Hypertension     Past Surgical History:  Procedure Laterality Date  . CESAREAN SECTION         History of present illness and  Hospital Course:     Kindly see H&P for history of present illness and admission details, please review complete Labs, Consult reports and Test reports for all details in brief  HPI  from the history and physical done on the day of admission Admitted for polyuria polydipsia, generalized weakness and found to have new onset diabetes mellitus, blood sugar more than 700 when she came and also found to have hyponatremia.   Hospital Course  1 new onset diabetes mellitus with hyperglycemia, symptoms of polyuria, polydipsia and generalized weakness, and blood sugar more than 700 on admission, had hyponatremia with sodium 128 on admission. Patient admitted to  Medical service., received IV fluids, insulin, metformin. Seen by diabetes coordinator., hemoglobin A1c is 10.7. Patient feels better today, we recommended metformin 1 g by mouth twice a day, Amaryl 2 mg daily, patient is  given education about checking the sugars, side effects of metformin including diarrhea, explained about hypoglycemia with Amaryl. And patient verbalized understanding. And provided a work note. #2 . essential hypertension #3. hyperlipidemia     Discharge Condition:stable   Follow UP    She has appointment with Dr. Guerry Bruin on March 19th, 2018  Discharge Instructions  and  Discharge Medications     Allergies as of 05/26/2016   No Known Allergies     Medication List    STOP taking these medications   HYDROcodone-acetaminophen 5-325 MG tablet Commonly known as:  [place] these medications   glimepiride 2 MG tablet Commonly known as:  AMARYL Take 1 tablet (2 mg total) by mouth daily with breakfast.   ibuprofen 800 MG tablet Commonly known as:  ADVIL,MOTRIN Take 1 tablet (800 mg total) by mouth every 8 (eight) hours as needed for moderate pain.   lisinopril 20 MG tablet Commonly known as:  PRINIVIL,ZESTRIL Take 20 mg by mouth daily.   metFORMIN 500 MG tablet Commonly known as:  GLUCOPHAGE Take 2 tablets (1,000 mg total) by mouth 2 (two) times daily with a meal.   pravastatin 20 MG tablet Commonly known as:  PRAVACHOL Take 20 mg by mouth daily.         Diet and Activity recommendation: See Discharge Instructions above   Consults obtained -none   Major procedures and Radiology Reports - PLEASE review detailed and final reports for all details, in brief -      No results found.  Micro Results    No results found for this or any previous visit (from the past 240 hour(s)).  Today   Subjective:   Chloe Alvarado today has no headache,no chest abdominal pain,no new weakness tingling or numbness, feels much better wants to go home today.  Objective:   Blood pressure 124/90, pulse 85, temperature 98.7 F (37.1 C), resp. rate 16, height 5\' 3"  (1.6 m), weight 97.1 kg (214 lb), SpO2 99 %.   Intake/Output Summary (Last 24 hours) at 05/26/16  1229 Last data filed at 05/25/16 1900  Gross per 24 hour  Intake              480 ml  Output                0 ml  Net              480 ml    Exam Awake Alert, Oriented x 3, No new F.N deficits, Normal affect [place].AT,PERRAL Supple Neck,No JVD, No cervical lymphadenopathy appriciated.  Symmetrical Chest wall movement, Good air movement bilaterally, CTAB RRR,No Gallops,Rubs or new Murmurs, No Parasternal Heave +ve B.Sounds, Abd Soft, Non tender, No organomegaly appriciated, No rebound -guarding or rigidity. No Cyanosis, Clubbing or edema, No new Rash or bruise  Data Review   CBC w Diff: Lab Results  Component Value Date   WBC 7.4 05/25/2016   HGB 12.7 05/25/2016   HCT 35.4 05/25/2016   PLT 296 05/25/2016    CMP: Lab Results  Component Value Date   NA 134 (L) 05/25/2016   K 3.3 (L) 05/25/2016   CL 103 05/25/2016   CO2 23 05/25/2016   BUN 21 (H) 05/25/2016   CREATININE 0.82 05/25/2016   PROT 7.1 05/25/2016   ALBUMIN 3.7 05/25/2016   BILITOT 0.9 05/25/2016   ALKPHOS 37 (L) 05/25/2016   AST 19 05/25/2016   ALT 25 05/25/2016  .   Total Time in preparing paper work, data evaluation and todays exam - 58 minutes  Declin Rajan M.D on 05/26/2016 at 12:29 PM    Note: This dictation was prepared with Dragon dictation along with smaller phrase technology. Any transcriptional errors that result from this process are unintentional.

## 2016-05-26 NOTE — Progress Notes (Signed)
Discharge instructions reviewed with patient. Patient verbalized understanding and was able to demonstrate competency utilizing the teach back method. Patient instructed on how to utilize blood glucose meter and provided diabetic education regarding oral/insulin administration, proper nutrition and the need for medical follow up with primary care doctor. IV dc'ed by Sydnee Cabal, RN. Patient provided with doctor's note. Patient refused wheelchair to car, gait steady. Patient escorted to vehicle by boyfriend and volunteer services.

## 2016-05-26 NOTE — Progress Notes (Signed)
     Chloe Alvarado was admitted to the Hospital on 05/24/2016 and Discharged  05/26/2016 and should be excused from work/school   for 3  days starting 05/24/2016 , may return to work/school without any restrictions.    Epifanio Lesches M.D on 05/26/2016,at 9:40 AM

## 2016-05-26 NOTE — Care Management (Signed)
Admitted to [facility] with the diagnosis of hyperglycemia. Friend is Sharlene Motts [phone]). Next appointment with Dr. Guerry Bruin is 06/08/16 at [facility] in [place]. No insurance. Takes care of all basic and instrumental activities of daily living herself, drives. Friend will transport. Spoke with diabetic coordinator yesterday. Gave Ms. Cates diabetic starter kit that was donated to Care Management Department.  Discharge to home today per Dr. Vianne Bulls. Shelbie Ammons RN MSN CCM Care Management

## 2016-07-20 ENCOUNTER — Inpatient Hospital Stay
Admission: RE | Admit: 2016-07-20 | Discharge: 2016-07-20 | Disposition: A | Discharge status: Home / Self Care | Payer: Self-pay | Source: Ambulatory Visit | Attending: *Deleted | Admitting: *Deleted

## 2016-07-20 ENCOUNTER — Other Ambulatory Visit: Payer: Self-pay | Admitting: *Deleted

## 2016-07-20 DIAGNOSIS — Z1231 Encounter for screening mammogram for malignant neoplasm of breast: Secondary | ICD-10-CM

## 2016-08-10 ENCOUNTER — Ambulatory Visit: Payer: Self-pay | Attending: Oncology | Admitting: *Deleted

## 2016-08-10 ENCOUNTER — Encounter: Payer: Self-pay | Admitting: *Deleted

## 2016-08-10 ENCOUNTER — Encounter (INDEPENDENT_AMBULATORY_CARE_PROVIDER_SITE_OTHER): Payer: Self-pay

## 2016-08-10 ENCOUNTER — Ambulatory Visit
Admission: RE | Admit: 2016-08-10 | Discharge: 2016-08-10 | Disposition: A | Discharge status: Home / Self Care | Payer: Self-pay | Source: Ambulatory Visit | Attending: Oncology | Admitting: Oncology

## 2016-08-10 VITALS — BP 108/76 | HR 68 | Temp 98.3°F | Ht 64.0 in | Wt 203.0 lb

## 2016-08-10 DIAGNOSIS — N6489 Other specified disorders of breast: Secondary | ICD-10-CM

## 2016-08-10 NOTE — Patient Instructions (Signed)
Gave patient hand-out, Women Staying Healthy, Active and Well from [facility], with education on breast health, pap smears, heart and colon health. 

## 2016-08-10 NOTE — Progress Notes (Signed)
Subjective:     Patient ID: Chloe Alvarado, female   DOB: 1965-09-12, 51 y.o.   MRN: [id number]  HPI   Review of Systems     Objective:   Physical Exam  Pulmonary/Chest: Right breast exhibits no inverted nipple, no mass, no nipple discharge, no skin change and no tenderness. Left breast exhibits no inverted nipple, no mass, no nipple discharge, no skin change and no tenderness. Breasts are symmetrical.       Assessment:     51 year old Black female referred from [facility] for financial assistance for further evaluation of a recent birads 0 mammogram.  Mammo completed May 28, 2016 on the [facility] mobile unit.  On clinical breast exam there is no dominate mass, skin changes, nipple discharge or lymphadenopathy.  Taught self breast awareness.  Last pap was November 2017 per patient.  Patient has been screened for eligibility.  She does not have any insurance, Medicare or Medicaid.  She also meets financial eligibility.  Hand-out given on the Affordable Care Act.    Plan:     Uni left mammo and ultrasound ordered for further evaluation of an asymmetric density.  Will follow up per [facility] protocol.

## 2016-08-11 ENCOUNTER — Encounter: Payer: Self-pay | Admitting: *Deleted

## 2016-08-11 NOTE — Progress Notes (Signed)
Letter mailed from [facility] to inform patient of her normal mammogram results.  Patient is to follow-up with annual screening in one year.  HSIS to [place]. 

## 2016-09-17 ENCOUNTER — Encounter: Payer: Self-pay | Admitting: Radiology

## 2017-02-17 ENCOUNTER — Encounter: Payer: Self-pay | Admitting: Emergency Medicine

## 2017-02-17 ENCOUNTER — Other Ambulatory Visit: Payer: Self-pay

## 2017-02-17 ENCOUNTER — Emergency Department: Payer: Self-pay

## 2017-02-17 ENCOUNTER — Emergency Department
Admission: EM | Admit: 2017-02-17 | Discharge: 2017-02-17 | Disposition: A | Discharge status: Home / Self Care | Payer: Self-pay | Attending: Emergency Medicine | Admitting: Emergency Medicine

## 2017-02-17 DIAGNOSIS — M545 Low back pain, unspecified: Secondary | ICD-10-CM

## 2017-02-17 DIAGNOSIS — Z79899 Other long term (current) drug therapy: Secondary | ICD-10-CM | POA: Insufficient documentation

## 2017-02-17 DIAGNOSIS — I10 Essential (primary) hypertension: Secondary | ICD-10-CM | POA: Insufficient documentation

## 2017-02-17 DIAGNOSIS — E119 Type 2 diabetes mellitus without complications: Secondary | ICD-10-CM | POA: Insufficient documentation

## 2017-02-17 DIAGNOSIS — Z7984 Long term (current) use of oral hypoglycemic drugs: Secondary | ICD-10-CM | POA: Insufficient documentation

## 2017-02-17 HISTORY — DX: Type 2 diabetes mellitus without complications: E11.9

## 2017-02-17 MED ORDER — MELOXICAM 15 MG PO TABS: 15.0000 mg | ORAL_TABLET | Freq: Every day | ORAL | 0 refills | Status: AC

## 2017-02-17 MED ORDER — KETOROLAC TROMETHAMINE 60 MG/2ML IM SOLN
60.0000 mg | Freq: Once | INTRAMUSCULAR | Status: AC
  Administered 2017-02-17: 60 mg via INTRAMUSCULAR
  Filled 2017-02-17: qty 2

## 2017-02-17 MED ORDER — ORPHENADRINE CITRATE 30 MG/ML IJ SOLN
60.0000 mg | Freq: Two times a day (BID) | INTRAMUSCULAR | Status: DC
  Administered 2017-02-17: 60 mg via INTRAMUSCULAR
  Filled 2017-02-17: qty 2

## 2017-02-17 MED ORDER — TRAMADOL HCL 50 MG PO TABS: 50.0000 mg | ORAL_TABLET | Freq: Four times a day (QID) | ORAL | 0 refills | Status: DC | PRN

## 2017-02-17 MED ORDER — CYCLOBENZAPRINE HCL 10 MG PO TABS: 10.0000 mg | ORAL_TABLET | Freq: Three times a day (TID) | ORAL | 0 refills | Status: AC | PRN

## 2017-02-17 MED ORDER — ORPHENADRINE CITRATE 30 MG/ML IJ SOLN: 60.0000 mg | Freq: Two times a day (BID) | INTRAMUSCULAR | Status: DC

## 2017-02-17 NOTE — ED Provider Notes (Signed)
[facility] Emergency Department Provider Note   ____________________________________________   First MD Initiated Contact with Patient 02/17/17 [phone]     (approximate)  I have reviewed the triage vital signs and the nursing notes.   HISTORY  Chief Complaint Back Pain    HPI Chloe Alvarado is a 51 y.o. female patient complain of increase her chronic low back pain. Patient state pain increased 2 days ago. 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 Patient denies radicular component to her back pain. Patient denies bladder or bowel dysfunction. No palliative measures for complaint.Patient rates the pain as a 10 over 10. Patient describes the pain as "achy".   Past Medical History:  Diagnosis Date  . Borderline diabetic   . Diabetes mellitus without complication ([place])   . High cholesterol   . Hypertension     Patient Active Problem List   Diagnosis Date Noted  . Hyperglycemia 05/24/2016    Past Surgical History:  Procedure Laterality Date  . CESAREAN SECTION      Prior to Admission medications   Medication Sig Start Date End Date Taking? Authorizing Provider  cyclobenzaprine (FLEXERIL) 10 MG tablet Take 1 tablet (10 mg total) by mouth 3 (three) times daily as needed. 02/17/17   Sable Feil, PA-C  glimepiride (AMARYL) 2 MG tablet Take 1 tablet (2 mg total) by mouth daily with breakfast. 05/26/16   Epifanio Lesches, MD  ibuprofen (ADVIL,MOTRIN) 800 MG tablet Take 1 tablet (800 mg total) by mouth every 8 (eight) hours as needed for moderate pain. Patient not taking: Reported on 05/24/2016 03/10/16   Paulette Blanch, MD  lisinopril (PRINIVIL,ZESTRIL) 20 MG tablet Take 20 mg by mouth daily.    [provider]  meloxicam (MOBIC) 15 MG tablet Take 1 tablet (15 mg  total) by mouth daily. 02/17/17   Sable Feil, PA-C  metFORMIN (GLUCOPHAGE) 500 MG tablet Take 2 tablets (1,000 mg total) by mouth 2 (two) times daily with a meal. 05/26/16   Epifanio Lesches, MD  pravastatin (PRAVACHOL) 20 MG tablet Take 20 mg by mouth daily.    [provider]  traMADol (ULTRAM) 50 MG tablet Take 1 tablet (50 mg total) by mouth every 6 (six) hours as needed for moderate pain. 02/17/17   Sable Feil, PA-C    Allergies Patient has no known allergies.  Family History  Problem Relation Age of Onset  . Hypertension Mother   . Prostate cancer Father   . Breast cancer Maternal Aunt     Social History Social History   Tobacco Use  . Smoking status: Never Smoker  . Smokeless tobacco: Never Used  Substance Use Topics  . Alcohol use: No  . Drug use: No    Review of Systems  Constitutional: No fever/chills Eyes: No visual changes. ENT: No sore throat. Cardiovascular: Denies chest pain. Respiratory: Denies shortness of breath. Gastrointestinal: No abdominal pain.  No nausea, no vomiting.  No diarrhea.  No constipation. Genitourinary: Negative for dysuria. Musculoskeletal: Positive for back pain. Skin: Negative for rash. Neurological: Negative for headaches, focal weakness or numbness. Endocrine:Diabetes, hyperlipidemia, and hypertension.  ____________________________________________   PHYSICAL EXAM:  VITAL SIGNS: ED Triage Vitals  Enc Vitals Group     BP 02/17/17 0910 113/75     Pulse Rate 02/17/17 0910 73  Resp 02/17/17 0910 18     Temp 02/17/17 0910 98.6 F (37 C)     Temp Source 02/17/17 0910 Oral     SpO2 02/17/17 0910 98 %     Weight 02/17/17 0908 202 lb (91.6 kg)     Height 02/17/17 0908 5\' 3"  (1.6 m)     Head Circumference --      Peak Flow --      Pain Score 02/17/17 0908 10     Pain Loc --      Pain Edu? --      Excl. in [place]? --    Constitutional: Alert and oriented. Well appearing and in no acute distress. Neck:  No stridor.  No cervical spine tenderness to palpation. Hematological/Lymphatic/Immunilogical: No cervical lymphadenopathy. Cardiovascular: Normal rate, regular rhythm. Grossly normal heart sounds.  Good peripheral circulation. Respiratory: Normal respiratory effort.  No retractions. Lungs CTAB. Gastrointestinal: Soft and nontender. No distention. No abdominal bruits. No CVA tenderness. Musculoskeletal: No obvious lumbar deformity. Patient has moderate guarding palpation L3-L5. Decreased range of motion with flexion. Left paraspinal muscle spasms. Negative straight leg test. Neurologic:  Normal speech and language. No gross focal neurologic deficits are appreciated. No gait instability. Skin:  Skin is warm, dry and intact. No rash noted. Psychiatric: Mood and affect are normal. Speech and behavior are normal.  ____________________________________________   LABS (all labs ordered are listed, but only abnormal results are displayed)  Labs Reviewed - No data to display ____________________________________________  EKG   ____________________________________________  RADIOLOGY  Dg Lumbar Spine 2-3 Views  Result Date: 02/17/2017 CLINICAL DATA:  Low back pain EXAM: LUMBAR SPINE - 2-3 VIEW COMPARISON:  None. FINDINGS: Degenerative disc disease changes at L5-S1 with disc space narrowing and spurring. Degenerative facet disease at L4-5 and L5-S1. Normal alignment. No fracture. Sclerosis around the SI joints bilateral compatible with sacroiliitis. IMPRESSION: No acute bony abnormality. Degenerative disc and facet disease in the lower lumbar spine as above. Evidence of bilateral sacroiliitis Electronically Signed   By: Rolm Baptise M.D.   On: 02/17/2017 09:56   X-rays reveal degenerative changes of the lumbar spine. ____________________________________________   PROCEDURES  Procedure(s) performed: None  Procedures  Critical Care performed:  No  ____________________________________________   INITIAL IMPRESSION / ASSESSMENT AND PLAN / ED COURSE  As part of my medical decision making, I reviewed the following data within the [place] back pain secondary to also arthritis. Discussed x-ray finding with patient. Patient given discharge instructions follow with PCP for continual care.      ____________________________________________   FINAL CLINICAL IMPRESSION(S) / ED DIAGNOSES  Final diagnoses:  Acute bilateral low back pain without sciatica     ED Discharge Orders        Ordered    meloxicam (MOBIC) 15 MG tablet  Daily     02/17/17 1021    traMADol (ULTRAM) 50 MG tablet  Every 6 hours PRN     02/17/17 1021    cyclobenzaprine (FLEXERIL) 10 MG tablet  3 times daily PRN     02/17/17 1021       Note:  This document was prepared using Dragon voice recognition software and may include unintentional dictation errors.    Sable Feil, PA-C 02/17/17 1022    Earleen Newport, MD 02/22/17 [phone]

## 2017-02-17 NOTE — ED Notes (Signed)
Patient transported to X-ray 

## 2017-02-17 NOTE — ED Triage Notes (Signed)
C/O low back pain.  Onset of symptoms Monday.  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

## 2017-04-28 ENCOUNTER — Encounter: Payer: Self-pay | Admitting: Emergency Medicine

## 2017-04-28 ENCOUNTER — Other Ambulatory Visit: Payer: Self-pay

## 2017-04-28 ENCOUNTER — Emergency Department: Payer: Medicaid Other

## 2017-04-28 DIAGNOSIS — I10 Essential (primary) hypertension: Secondary | ICD-10-CM | POA: Insufficient documentation

## 2017-04-28 DIAGNOSIS — Z79899 Other long term (current) drug therapy: Secondary | ICD-10-CM | POA: Insufficient documentation

## 2017-04-28 DIAGNOSIS — R05 Cough: Secondary | ICD-10-CM | POA: Insufficient documentation

## 2017-04-28 DIAGNOSIS — E119 Type 2 diabetes mellitus without complications: Secondary | ICD-10-CM | POA: Insufficient documentation

## 2017-04-28 DIAGNOSIS — E7800 Pure hypercholesterolemia, unspecified: Secondary | ICD-10-CM | POA: Insufficient documentation

## 2017-04-28 DIAGNOSIS — Z7984 Long term (current) use of oral hypoglycemic drugs: Secondary | ICD-10-CM | POA: Insufficient documentation

## 2017-04-28 DIAGNOSIS — R51 Headache: Secondary | ICD-10-CM | POA: Insufficient documentation

## 2017-04-28 NOTE — ED Triage Notes (Signed)
Patient ambulatory to triage with steady gait, without difficulty or distress noted, mask in place; pt reports nonprod cough x 2 months and HA to temples

## 2017-04-29 ENCOUNTER — Emergency Department
Admission: EM | Admit: 2017-04-29 | Discharge: 2017-04-29 | Disposition: A | Discharge status: Home / Self Care | Payer: Medicaid Other | Attending: Emergency Medicine | Admitting: Emergency Medicine

## 2017-04-29 DIAGNOSIS — R05 Cough: Secondary | ICD-10-CM

## 2017-04-29 DIAGNOSIS — R053 Chronic cough: Secondary | ICD-10-CM

## 2017-04-29 MED ORDER — AMOXICILLIN-POT CLAVULANATE 875-125 MG PO TABS
1.0000 | ORAL_TABLET | Freq: Once | ORAL | Status: AC
  Administered 2017-04-29: 1 via ORAL
  Filled 2017-04-29: qty 1

## 2017-04-29 MED ORDER — BENZONATATE 100 MG PO CAPS: 100.0000 mg | ORAL_CAPSULE | Freq: Three times a day (TID) | ORAL | 0 refills | Status: AC | PRN

## 2017-04-29 MED ORDER — AMOXICILLIN-POT CLAVULANATE 875-125 MG PO TABS: 1.0000 | ORAL_TABLET | Freq: Two times a day (BID) | ORAL | 0 refills | Status: AC

## 2017-04-29 MED ORDER — TRIAMTERENE-HCTZ 37.5-25 MG PO TABS: 1.0000 | ORAL_TABLET | Freq: Every day | ORAL | 5 refills | Status: AC

## 2017-04-29 MED ORDER — BENZONATATE 100 MG PO CAPS
100.0000 mg | ORAL_CAPSULE | Freq: Once | ORAL | Status: AC
  Administered 2017-04-29: 100 mg via ORAL
  Filled 2017-04-29: qty 1

## 2017-04-29 NOTE — ED Provider Notes (Signed)
[facility] Emergency Department Provider Note   First MD Initiated Contact with Patient 04/29/17 [phone]     (approximate)  I have reviewed the triage vital signs and the nursing notes.   HISTORY  Chief Complaint Cough and Headache    HPI Shahd Alayzia Pavlock is a 52 y.o. female with history of diabetes hyperlipidemia and hypertension presents to the emergency department with a greater than 60-month history of chronic cough.  Patient states cough occurs nightly.  Patient denies any fever.  Patient denies any chest pain.  Patient denies any current tobacco use but states that she smoked when she was much much younger.   Past Medical History:  Diagnosis Date  . Borderline diabetic   . Diabetes mellitus without complication ([place])   . High cholesterol   . Hypertension     Patient Active Problem List   Diagnosis Date Noted  . Hyperglycemia 05/24/2016    Past Surgical History:  Procedure Laterality Date  . CESAREAN SECTION      Prior to Admission medications   Medication Sig Start Date End Date Taking? Authorizing Provider  cyclobenzaprine (FLEXERIL) 10 MG tablet Take 1 tablet (10 mg total) by mouth 3 (three) times daily as needed. 02/17/17   Sable Feil, PA-C  glimepiride (AMARYL) 2 MG tablet Take 1 tablet (2 mg total) by mouth daily with breakfast. 05/26/16   Epifanio Lesches, MD  ibuprofen (ADVIL,MOTRIN) 800 MG tablet Take 1 tablet (800 mg total) by mouth every 8 (eight) hours as needed for moderate pain. Patient not taking: Reported on 05/24/2016 03/10/16   Paulette Blanch, MD  lisinopril (PRINIVIL,ZESTRIL) 20 MG tablet Take 20 mg by mouth daily.    [provider]  meloxicam (MOBIC) 15 MG tablet Take 1 tablet (15 mg total) by mouth daily. 02/17/17   Sable Feil, PA-C  metFORMIN (GLUCOPHAGE) 500 MG tablet Take 2 tablets (1,000 mg total) by mouth 2 (two) times daily with a meal. 05/26/16   Epifanio Lesches, MD  pravastatin  (PRAVACHOL) 20 MG tablet Take 20 mg by mouth daily.    [provider]  traMADol (ULTRAM) 50 MG tablet Take 1 tablet (50 mg total) by mouth every 6 (six) hours as needed for moderate pain. 02/17/17   Sable Feil, PA-C    Allergies No known drug allergies  Family History  Problem Relation Age of Onset  . Hypertension Mother   . Prostate cancer Father   . Breast cancer Maternal Aunt     Social History Social History   Tobacco Use  . Smoking status: Never Smoker  . Smokeless tobacco: Never Used  Substance Use Topics  . Alcohol use: No  . Drug use: No    Review of Systems Constitutional: No fever/chills Eyes: No visual changes. ENT: No sore throat. Cardiovascular: Denies chest pain. Respiratory: Denies shortness of breath.  Positive for cough Gastrointestinal: No abdominal pain.  No nausea, no vomiting.  No diarrhea.  No constipation. Genitourinary: Negative for dysuria. Musculoskeletal: Negative for neck pain.  Negative for back pain. Integumentary: Negative for rash. Neurological: Negative for headaches, focal weakness or numbness.   ____________________________________________   PHYSICAL EXAM:  VITAL SIGNS: ED Triage Vitals  Enc Vitals Group     BP 04/28/17 2338 134/89     Pulse Rate 04/28/17 2338 71     Resp 04/28/17 2338 18     Temp 04/28/17 2338 98.1 F (36.7 C)     Temp Source 04/28/17 2338 Oral  SpO2 04/28/17 2338 97 %     Weight 04/28/17 2338 93 kg (205 lb)     Height --      Head Circumference --      Peak Flow --      Pain Score 04/28/17 2344 8     Pain Loc --      Pain Edu? --      Excl. in [place]? --     Constitutional: Alert and oriented. Well appearing and in no acute distress. Eyes: Conjunctivae are normal.  Head: Atraumatic. Ears:  Healthy appearing ear canals and TMs bilaterally Nose: No congestion/rhinnorhea. Mouth/Throat: Mucous membranes are moist. Oropharynx non-erythematous. Neck: No stridor.   Cardiovascular: Normal  rate, regular rhythm. Good peripheral circulation. Grossly normal heart sounds. Respiratory: Normal respiratory effort.  No retractions. Lungs CTAB. Gastrointestinal: Soft and nontender. No distention.  Musculoskeletal: No lower extremity tenderness nor edema. No gross deformities of extremities. Neurologic:  Normal speech and language. No gross focal neurologic deficits are appreciated.  Skin:  Skin is warm, dry and intact. No rash noted. Psychiatric: Mood and affect are normal. Speech and behavior are normal.    RADIOLOGY I, [place], personally viewed and evaluated these images (plain radiographs) as part of my medical decision making, as well as reviewing the written report by the radiologist.  ED MD interpretation: No acute findings noted on chest x-ray  Official radiology report(s): Dg Chest 2 View  Result Date: 04/29/2017 CLINICAL DATA:  Chronic nonproductive cough. EXAM: CHEST  2 VIEW COMPARISON:  None. FINDINGS: The lungs are well-aerated and clear. There is no evidence of focal opacification, pleural effusion or pneumothorax. The heart is normal in size; the mediastinal contour is within normal limits. No acute osseous abnormalities are seen. IMPRESSION: No acute cardiopulmonary process seen. Electronically Signed   By: Garald Balding M.D.   On: 04/29/2017 00:21     Procedures   ____________________________________________   INITIAL IMPRESSION / ASSESSMENT AND PLAN / ED COURSE  As part of my medical decision making, I reviewed the following data within the electronic MEDICAL RECORD [id number] year old female presenting with chronic cough for greater than 2 months.  Consider the possibility of an ACE inhibitor related cough as the patient is taking lisinopril HCTZ for hypertension.  Also entertain the possibility of a postnasal drip given symptoms occurring when the patient is prone.  Plan to discontinue lisinopril and start patient on triamterene HCTZ.  Also given possibly  sinusitis patient given Augmentin 875 mg.  ____________________________________________  FINAL CLINICAL IMPRESSION(S) / ED DIAGNOSES  Final diagnoses:  Chronic cough     MEDICATIONS GIVEN DURING THIS VISIT:  Medications  amoxicillin-clavulanate (AUGMENTIN) 875-125 MG per tablet 1 tablet (not administered)  benzonatate (TESSALON) capsule 100 mg (not administered)     ED Discharge Orders    None       Note:  This document was prepared using Dragon voice recognition software and may include unintentional dictation errors.    Gregor Hams, MD 04/29/17 [phone]

## 2017-04-29 NOTE — ED Notes (Signed)
Patient updated on wait time 

## 2017-05-31 ENCOUNTER — Emergency Department
Admission: EM | Admit: 2017-05-31 | Discharge: 2017-05-31 | Disposition: A | Discharge status: Home / Self Care | Payer: Self-pay | Attending: Emergency Medicine | Admitting: Emergency Medicine

## 2017-05-31 DIAGNOSIS — E119 Type 2 diabetes mellitus without complications: Secondary | ICD-10-CM | POA: Insufficient documentation

## 2017-05-31 DIAGNOSIS — Z79899 Other long term (current) drug therapy: Secondary | ICD-10-CM | POA: Insufficient documentation

## 2017-05-31 DIAGNOSIS — Z7984 Long term (current) use of oral hypoglycemic drugs: Secondary | ICD-10-CM | POA: Insufficient documentation

## 2017-05-31 DIAGNOSIS — I10 Essential (primary) hypertension: Secondary | ICD-10-CM | POA: Insufficient documentation

## 2017-05-31 DIAGNOSIS — J020 Streptococcal pharyngitis: Secondary | ICD-10-CM | POA: Insufficient documentation

## 2017-05-31 LAB — INFLUENZA PANEL BY PCR (TYPE A & B)
INFLAPCR: NEGATIVE
Influenza B By PCR: NEGATIVE

## 2017-05-31 LAB — GLUCOSE, CAPILLARY: Glucose-Capillary: 143 mg/dL — ABNORMAL HIGH (ref 65–99)

## 2017-05-31 LAB — GROUP A STREP BY PCR: Group A Strep by PCR: DETECTED — AB

## 2017-05-31 MED ORDER — TRAMADOL HCL 50 MG PO TABS
50.0000 mg | ORAL_TABLET | Freq: Once | ORAL | Status: AC
  Administered 2017-05-31: 50 mg via ORAL
  Filled 2017-05-31: qty 1

## 2017-05-31 MED ORDER — TRAMADOL HCL 50 MG PO TABS: 50.0000 mg | ORAL_TABLET | Freq: Four times a day (QID) | ORAL | 0 refills | Status: AC | PRN

## 2017-05-31 MED ORDER — AMOXICILLIN 875 MG PO TABS: 875.0000 mg | ORAL_TABLET | Freq: Two times a day (BID) | ORAL | 0 refills | Status: AC

## 2017-05-31 NOTE — ED Provider Notes (Signed)
[facility] Emergency Department Provider Note  ____________________________________________   First MD Initiated Contact with Patient 05/31/17 1415     (approximate)  I have reviewed the triage vital signs and the nursing notes.   HISTORY  Chief Complaint Generalized Body Aches; Fever; Headache; and Cough    HPI Chloe Alvarado is a 52 y.o. female presents emergency department complaining of headache, sore throat, body aches, and nausea for 1 day.  She states she has been feeling well at all for the past 2 days.  She denies any cough or congestion.  She denies chest pain shortness of breath, she denies vomiting or diarrhea  Past Medical History:  Diagnosis Date  . Borderline diabetic   . Diabetes mellitus without complication ([place])   . High cholesterol   . Hypertension     Patient Active Problem List   Diagnosis Date Noted  . Hyperglycemia 05/24/2016    Past Surgical History:  Procedure Laterality Date  . CESAREAN SECTION      Prior to Admission medications   Medication Sig Start Date End Date Taking? Authorizing Provider  amoxicillin (AMOXIL) 875 MG tablet Take 1 tablet (875 mg total) by mouth 2 (two) times daily. 05/31/17   Fisher, Linden Dolin, PA-C  benzonatate (TESSALON PERLES) 100 MG capsule Take 1 capsule (100 mg total) by mouth 3 (three) times daily as needed for cough. 04/29/17 04/29/18  Gregor Hams, MD  cyclobenzaprine (FLEXERIL) 10 MG tablet Take 1 tablet (10 mg total) by mouth 3 (three) times daily as needed. 02/17/17   Sable Feil, PA-C  glimepiride (AMARYL) 2 MG tablet Take 1 tablet (2 mg total) by mouth daily with breakfast. 05/26/16   Epifanio Lesches, MD  ibuprofen (ADVIL,MOTRIN) 800 MG tablet Take 1 tablet (800 mg total) by mouth every 8 (eight) hours as needed for moderate pain. Patient not taking: Reported on 05/24/2016 03/10/16   Paulette Blanch, MD  lisinopril (PRINIVIL,ZESTRIL) 20 MG tablet Take 20 mg by mouth  daily.    [provider]  meloxicam (MOBIC) 15 MG tablet Take 1 tablet (15 mg total) by mouth daily. 02/17/17   Sable Feil, PA-C  metFORMIN (GLUCOPHAGE) 500 MG tablet Take 2 tablets (1,000 mg total) by mouth 2 (two) times daily with a meal. 05/26/16   Epifanio Lesches, MD  pravastatin (PRAVACHOL) 20 MG tablet Take 20 mg by mouth daily.    [provider]  traMADol (ULTRAM) 50 MG tablet Take 1 tablet (50 mg total) by mouth every 6 (six) hours as needed. 05/31/17   Fisher, Linden Dolin, PA-C  triamterene-hydrochlorothiazide (MAXZIDE-25) 37.5-25 MG tablet Take 1 tablet by mouth daily. 04/29/17 04/29/18  Gregor Hams, MD    Allergies Lisinopril  Family History  Problem Relation Age of Onset  . Hypertension Mother   . Prostate cancer Father   . Breast cancer Maternal Aunt     Social History Social History   Tobacco Use  . Smoking status: Never Smoker  . Smokeless tobacco: Never Used  Substance Use Topics  . Alcohol use: No  . Drug use: No    Review of Systems  Constitutional: Positive fever/chills Eyes: No visual changes. ENT: Positive sore throat. Respiratory: Denies cough Genitourinary: Negative for dysuria. Musculoskeletal: Negative for back pain. Skin: Negative for rash.    ____________________________________________   PHYSICAL EXAM:  VITAL SIGNS: ED Triage Vitals [05/31/17 1349]  Enc Vitals Group     BP 134/88     Pulse Rate Marland Kitchen)  104     Resp 20     Temp (!) 100.8 F (38.2 C)     Temp Source Oral     SpO2 97 %     Weight 209 lb (94.8 kg)     Height 5\' 3"  (1.6 m)     Head Circumference      Peak Flow      Pain Score 10     Pain Loc      Pain Edu?      Excl. in [place]?     Constitutional: Alert and oriented. Well appearing and in no acute distress. Eyes: Conjunctivae are normal.  Head: Atraumatic. Nose: No congestion/rhinnorhea. Mouth/Throat: Mucous membranes are moist.  Throat is bright red and swollen Cardiovascular: Normal rate,  regular rhythm.  Heart sounds are normal Respiratory: Normal respiratory effort.  No retractions, lungs are clear to auscultation GU: deferred Musculoskeletal: FROM all extremities, warm and well perfused Neurologic:  Normal speech and language.  Skin:  Skin is warm, dry and intact. No rash noted. Psychiatric: Mood and affect are normal. Speech and behavior are normal.  ____________________________________________   LABS (all labs ordered are listed, but only abnormal results are displayed)  Labs Reviewed  GROUP A STREP BY PCR - Abnormal; Notable for the following components:      Result Value   Group A Strep by PCR DETECTED (*)    All other components within normal limits  GLUCOSE, CAPILLARY - Abnormal; Notable for the following components:   Glucose-Capillary 143 (*)    All other components within normal limits  INFLUENZA PANEL BY PCR (TYPE A & B)   ____________________________________________   ____________________________________________  RADIOLOGY    ____________________________________________   PROCEDURES  Procedure(s) performed: No  Procedures    ____________________________________________   INITIAL IMPRESSION / ASSESSMENT AND PLAN / ED COURSE  Pertinent labs & imaging results that were available during my care of the patient were reviewed by me and considered in my medical decision making (see chart for details).  Patient is a 52 year old female complaining of flulike symptoms with sore throat.  On physical exam she appears to not feel well.  She is febrile.  Throat is bright red and swollen  Flu test is negative, strep test is positive  Test results were discussed with the patient and her fianc.  She was given a prescription for amoxicillin and tramadol.  She is to gargle with warm salt water.  Take Tylenol and ibuprofen as needed for pain and fever.  She is to drink plenty of fluids.  She states she understands will comply.  She was discharged in  stable condition     As part of my medical decision making, I reviewed the following data within the [place] notes reviewed and incorporated, Labs reviewed influenza is negative, strep is positive, Notes from prior ED visits and [place] Controlled Substance Database  ____________________________________________   FINAL CLINICAL IMPRESSION(S) / ED DIAGNOSES  Final diagnoses:  Acute streptococcal pharyngitis      NEW MEDICATIONS STARTED DURING THIS VISIT:  New Prescriptions   AMOXICILLIN (AMOXIL) 875 MG TABLET    Take 1 tablet (875 mg total) by mouth 2 (two) times daily.   TRAMADOL (ULTRAM) 50 MG TABLET    Take 1 tablet (50 mg total) by mouth every 6 (six) hours as needed.     Note:  This document was prepared using Dragon voice recognition software and may include unintentional dictation errors.    Fisher,  Linden Dolin, PA-C 05/31/17 1612    Lavonia Drafts, MD 06/01/17 [id number]

## 2017-05-31 NOTE — ED Notes (Signed)
Pt reports that she has headache, sore throat, generalized body aches, and nausea x1 day

## 2017-05-31 NOTE — Discharge Instructions (Signed)
Follow-up with your regular doctor if not better in 3-5 days.  Use medication as prescribed.  Gargle with warm salt water to help with the pain in the back of your throat.  Use Tylenol and ibuprofen for pain and fevers.  Drink plenty of fluids.  If you are worsening and cannot swallow liquids please return the emergency department

## 2017-05-31 NOTE — ED Triage Notes (Signed)
Pt reports bodyaches, fever, headache and just generally not feeling well for the past 2 days.

## 2017-12-13 ENCOUNTER — Encounter (INDEPENDENT_AMBULATORY_CARE_PROVIDER_SITE_OTHER): Payer: Self-pay

## 2017-12-13 ENCOUNTER — Encounter: Payer: Self-pay | Admitting: *Deleted

## 2017-12-13 ENCOUNTER — Ambulatory Visit
Admission: RE | Admit: 2017-12-13 | Discharge: 2017-12-13 | Disposition: A | Discharge status: Home / Self Care | Payer: Self-pay | Source: Ambulatory Visit | Attending: Oncology | Admitting: Oncology

## 2017-12-13 ENCOUNTER — Ambulatory Visit: Payer: Self-pay | Attending: Oncology | Admitting: *Deleted

## 2017-12-13 VITALS — BP 118/84 | HR 80 | Temp 98.1°F | Ht 63.0 in | Wt 204.0 lb

## 2017-12-13 DIAGNOSIS — Z0000 Encounter for general adult medical examination without abnormal findings: Secondary | ICD-10-CM

## 2017-12-13 NOTE — Progress Notes (Signed)
  Subjective:     Patient ID: Guerry Bruin, female   DOB: 1965/04/17, 52 y.o.   MRN: [id number]  HPI   Review of Systems     Objective:   Physical Exam  Pulmonary/Chest: Right breast exhibits no inverted nipple, no mass, no nipple discharge, no skin change and no tenderness. Left breast exhibits no inverted nipple, no mass, no nipple discharge, no skin change and no tenderness.       Assessment:     52 year old Black female returns to [facility] for annual screening.  Clinical breast exam unremarkable.  Taught self breast awareness.  Last pap on 01/27/17 was negative / negative.  Next pap due in 2023.  Patient has been screened for eligibility.  She does not have any insurance, Medicare or Medicaid.  She also meets financial eligibility.  Hand-out given on the Affordable Care Act. Risk Assessment    Risk Scores      12/13/2017   Last edited by: Rico Junker, RN   5-year risk: 1.2 %   Lifetime risk: 8.3 %            Plan:     Screening mammogram ordered.  Will follow-up per [facility]CP protocol.

## 2017-12-13 NOTE — Patient Instructions (Signed)
Gave patient hand-out, Women Staying Healthy, Active and Well from [facility], with education on breast health, pap smears, heart and colon health. 

## 2017-12-13 NOTE — Progress Notes (Signed)
Letter mailed from [facility] to inform patient of her normal mammogram results.  Patient is to follow-up with annual screening in one year.  HSIS to [place]. 

## 2018-10-31 ENCOUNTER — Other Ambulatory Visit: Payer: Self-pay | Admitting: Internal Medicine

## 2018-10-31 ENCOUNTER — Ambulatory Visit
Admission: RE | Admit: 2018-10-31 | Discharge: 2018-10-31 | Disposition: A | Discharge status: Home / Self Care | Payer: Disability Insurance | Source: Ambulatory Visit | Attending: Internal Medicine | Admitting: Internal Medicine

## 2018-10-31 DIAGNOSIS — M1711 Unilateral primary osteoarthritis, right knee: Secondary | ICD-10-CM

## 2018-10-31 DIAGNOSIS — M1712 Unilateral primary osteoarthritis, left knee: Secondary | ICD-10-CM | POA: Diagnosis present

## 2018-10-31 DIAGNOSIS — M47819 Spondylosis without myelopathy or radiculopathy, site unspecified: Secondary | ICD-10-CM

## 2019-01-26 ENCOUNTER — Other Ambulatory Visit: Payer: Self-pay | Admitting: *Deleted

## 2019-01-26 DIAGNOSIS — Z20822 Contact with and (suspected) exposure to covid-19: Secondary | ICD-10-CM

## 2019-01-28 LAB — NOVEL CORONAVIRUS, NAA: SARS-CoV-2, NAA: NOT DETECTED

## 2020-10-07 IMAGING — CR LEFT KNEE - 1-2 VIEW
1 series · 2 of 2 positions shown · non-contrast
Comparison: None.

CLINICAL DATA: Chronic left knee pain

EXAM:
LEFT KNEE - 1-2 VIEW

[Series 1: dg knee 1-2 views left · 0.14mm/px · 2 of 2 slices shown]
[im 1/2]
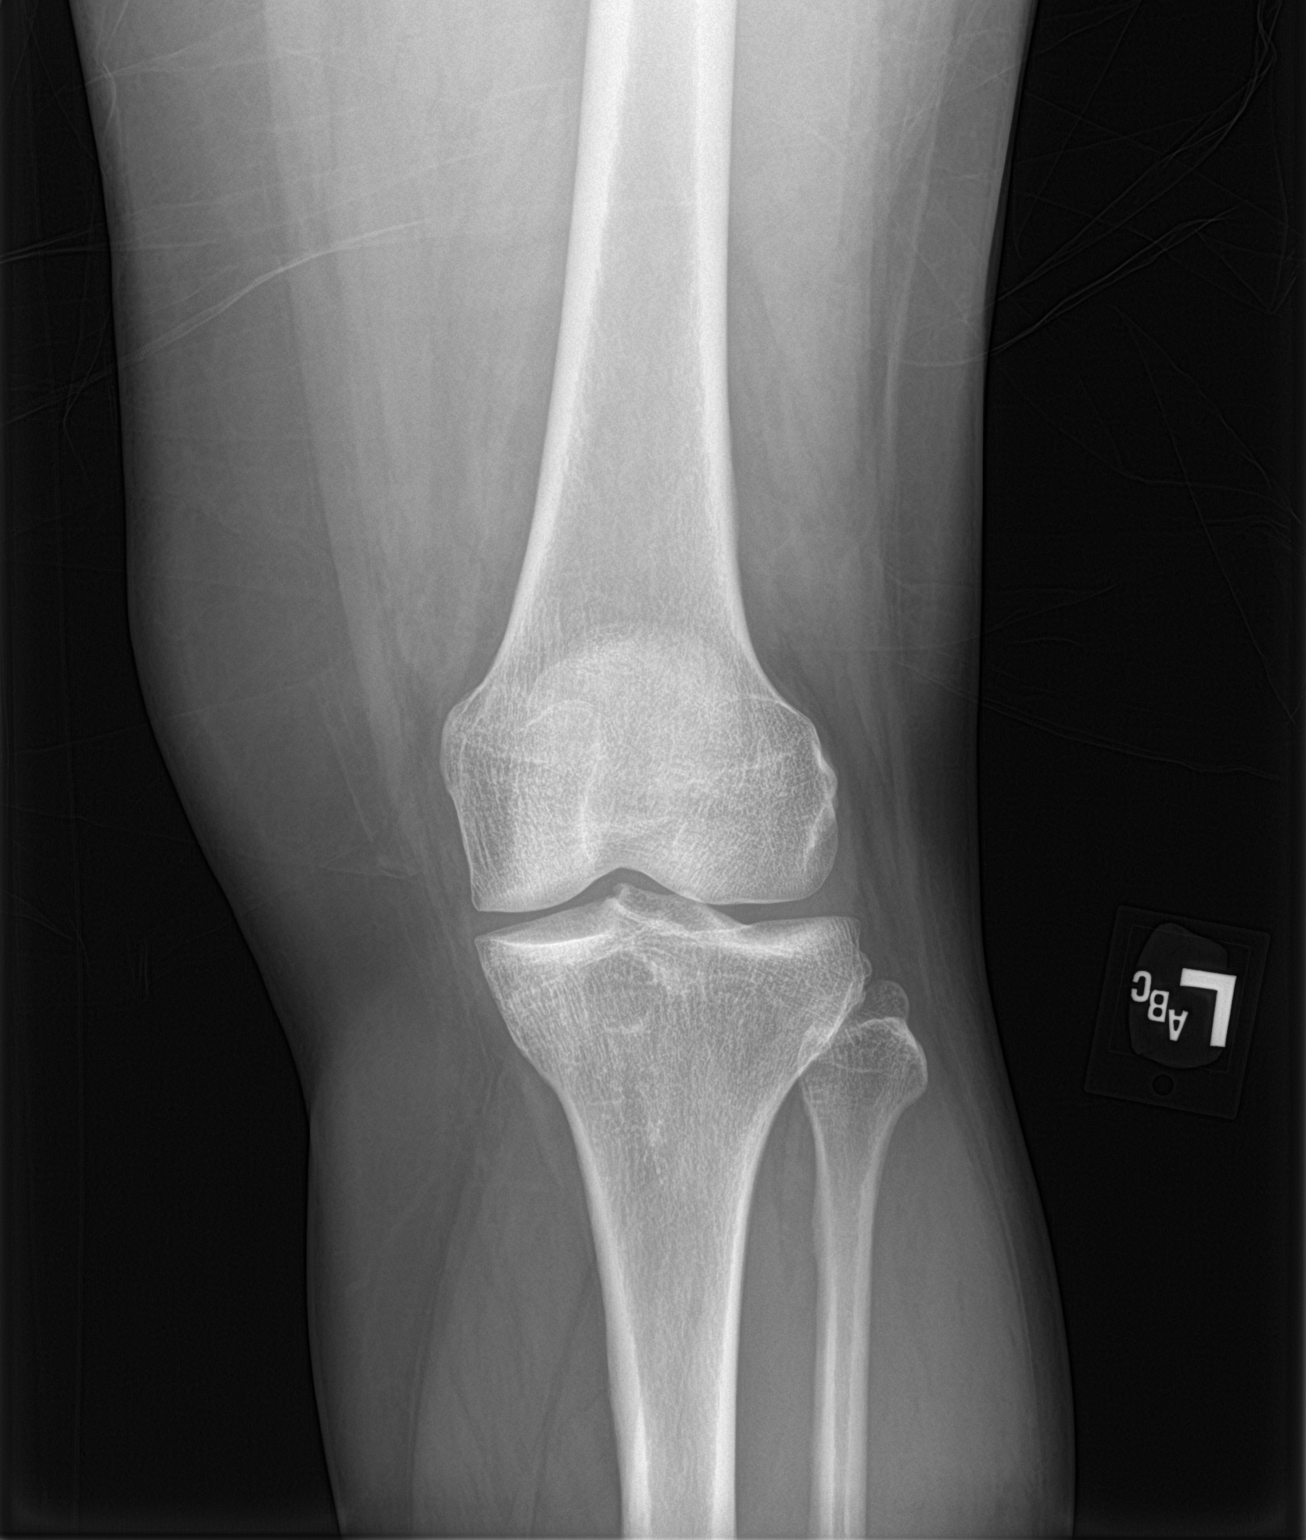
[im 2/2]
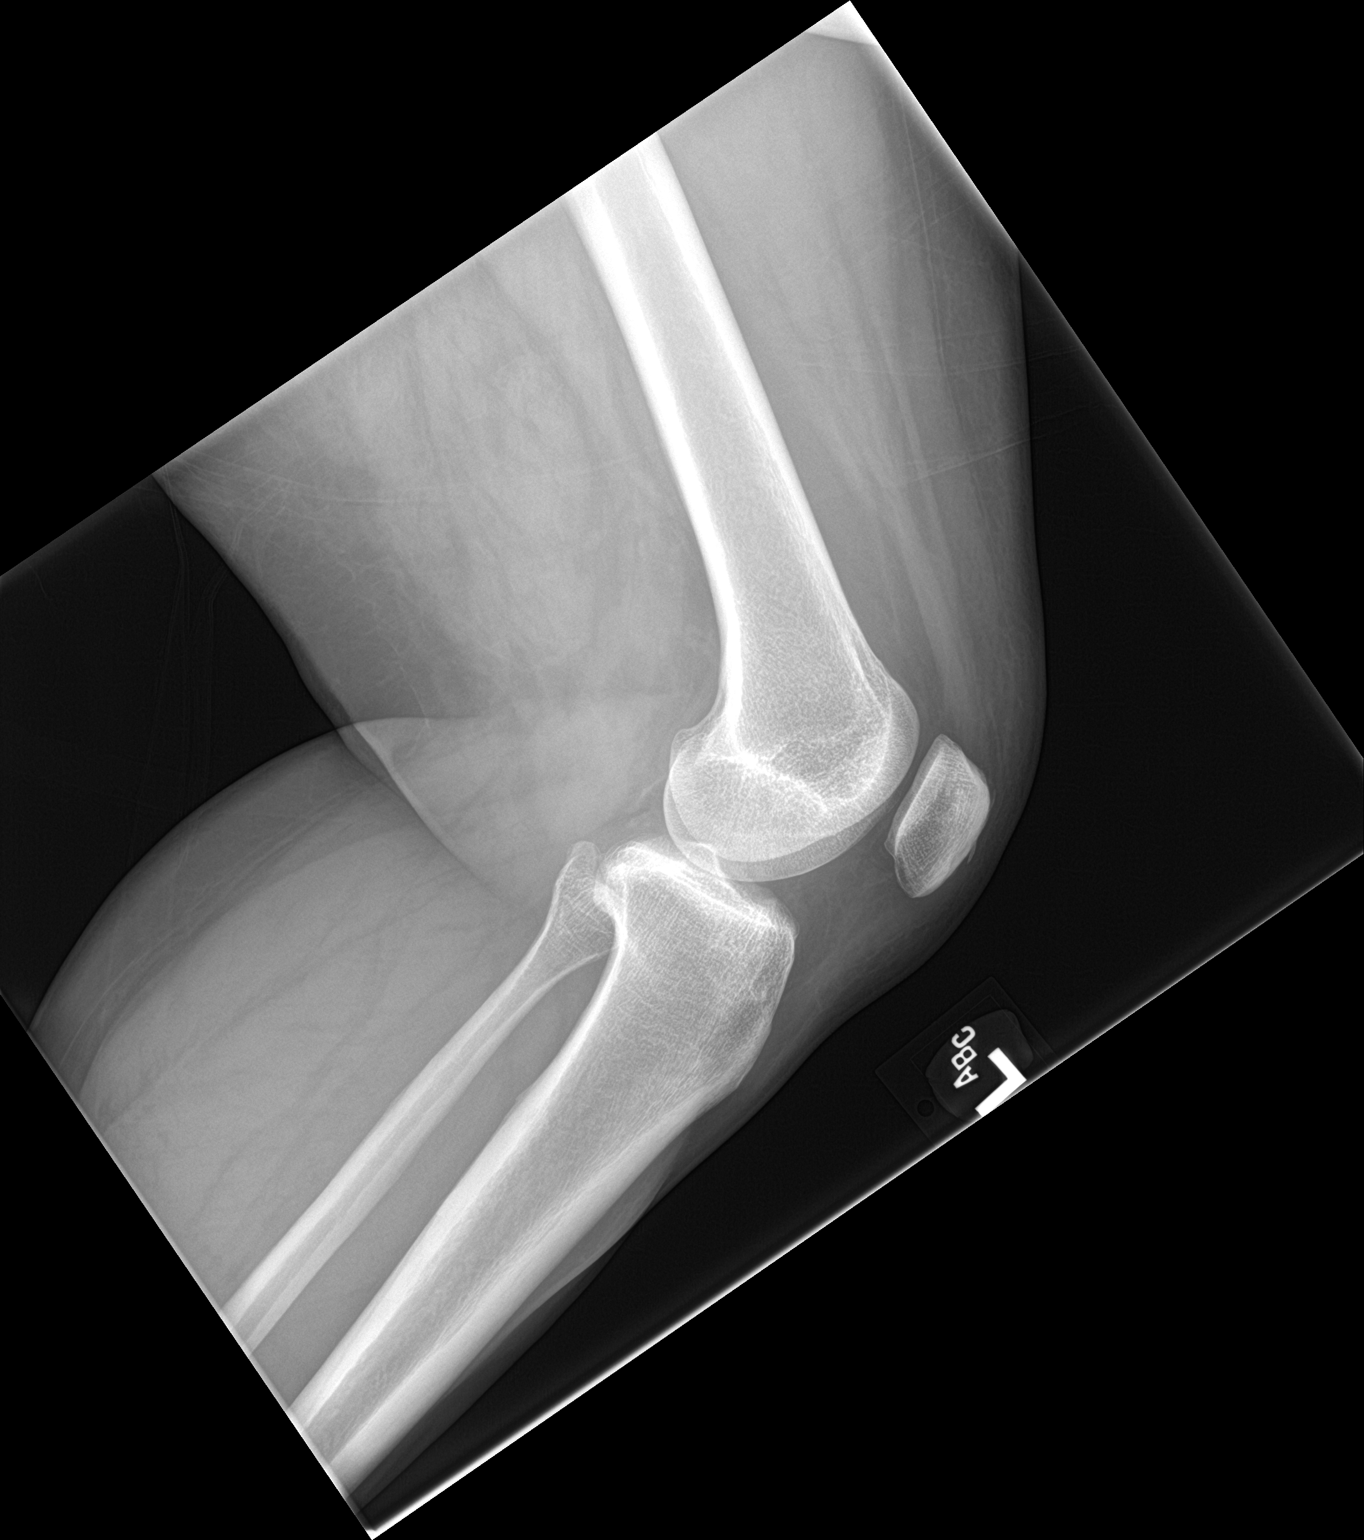

[2 of 2 positions shown; findings below may reference images not displayed]

FINDINGS: No evidence of fracture, dislocation, or joint effusion. No evidence
of arthropathy or other focal bone abnormality. Soft tissues are
unremarkable.
IMPRESSION: Unremarkable left knee radiographs.

## 2020-10-07 IMAGING — CR RIGHT KNEE - 1-2 VIEW
1 series · 2 of 2 positions shown · non-contrast
Comparison: None.

CLINICAL DATA: Chronic right knee pain

EXAM:
RIGHT KNEE - 1-2 VIEW

[Series 1: dg knee 1-2 views right · 0.14mm/px · 2 of 2 slices shown]
[im 1/2]
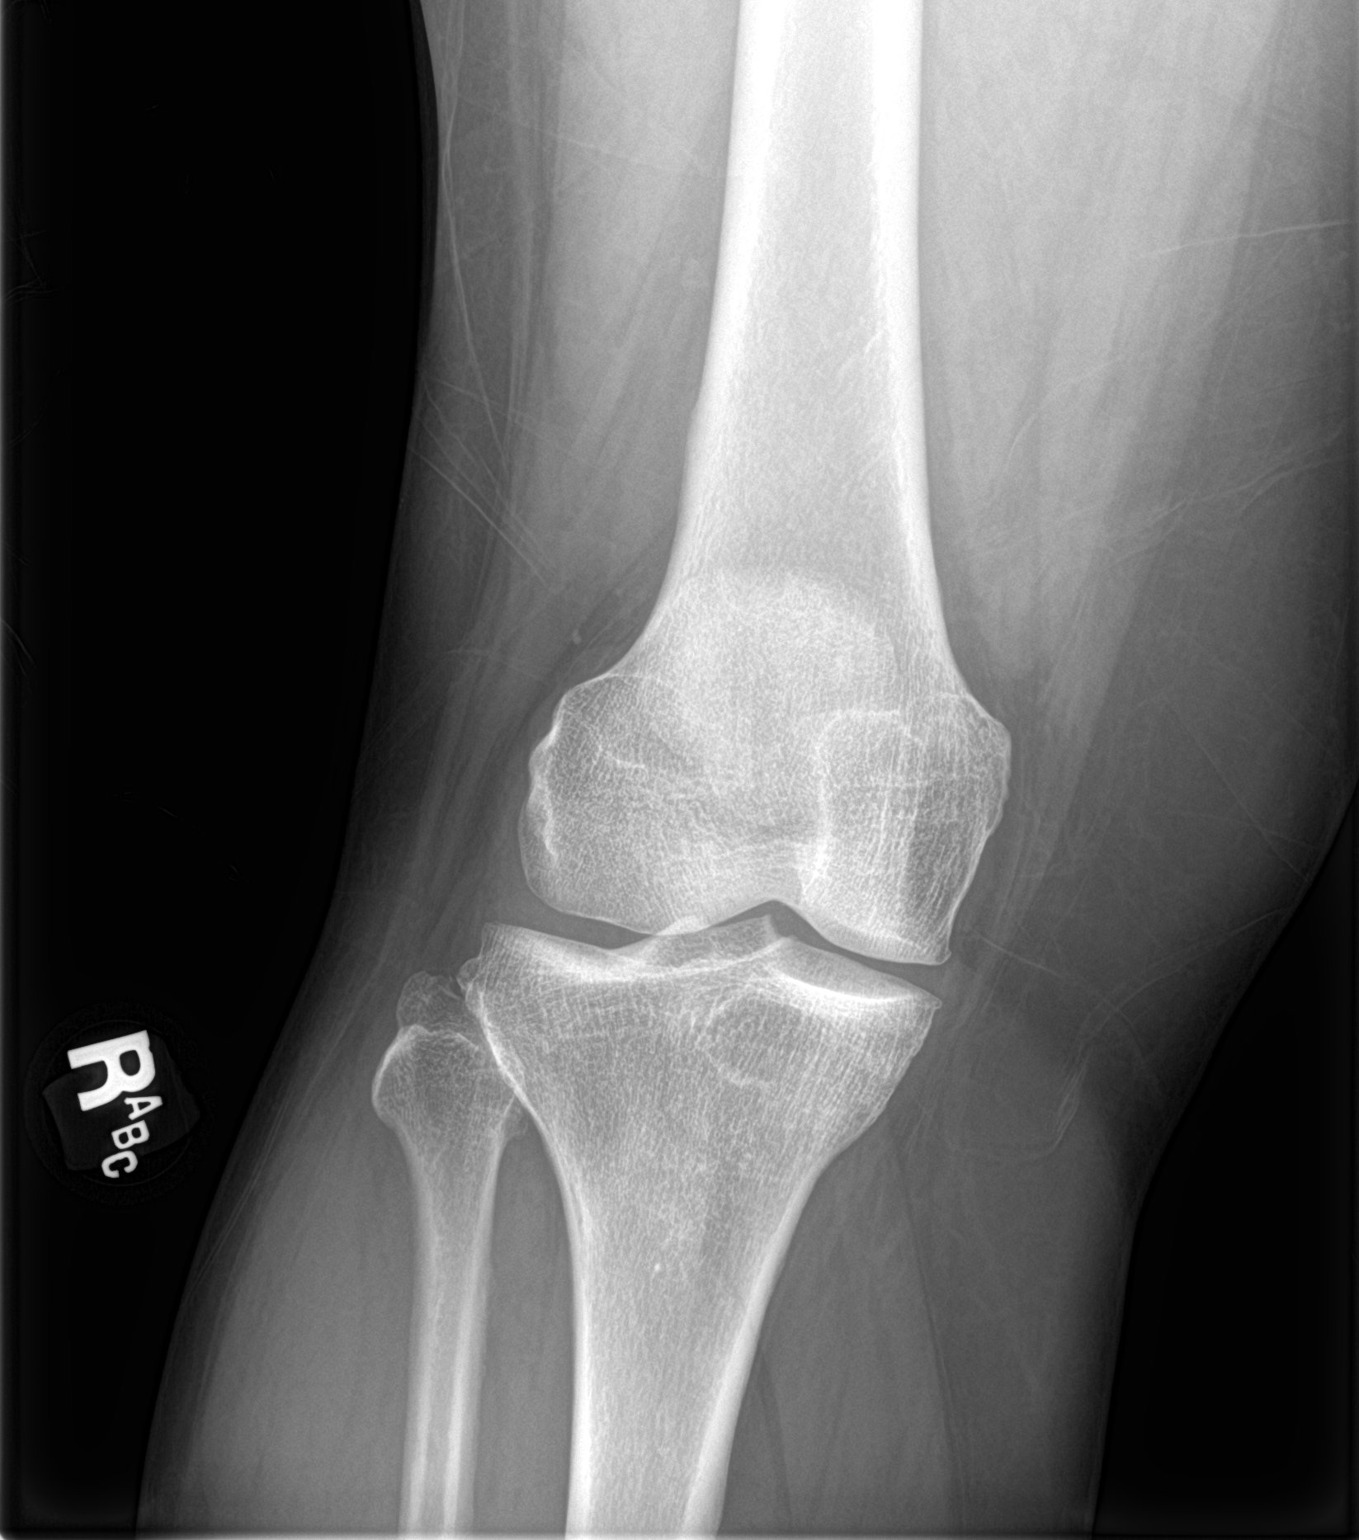
[im 2/2]
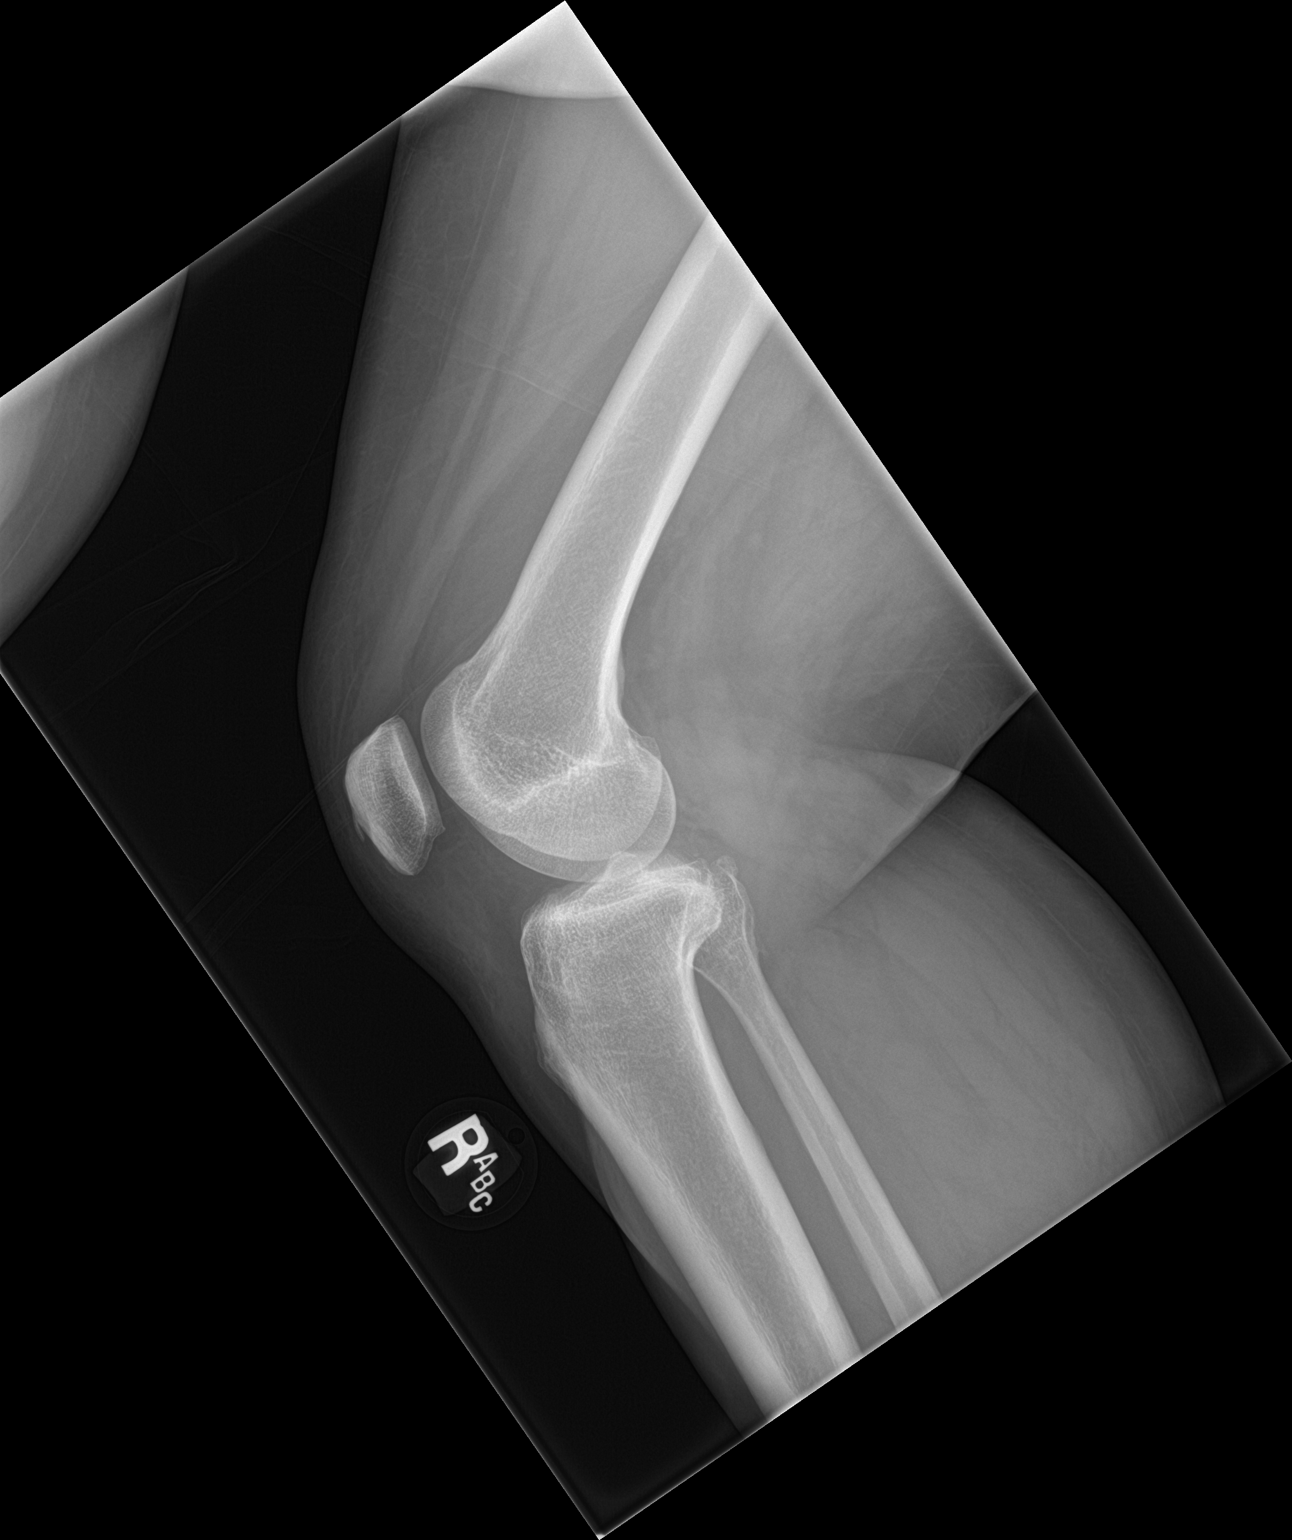

[2 of 2 positions shown; findings below may reference images not displayed]

FINDINGS: No acute fracture or malalignment. There is mild medial compartment
osteophyte formation. Joint spaces are preserved. No knee joint
effusion. Soft tissues within normal limits.
IMPRESSION: Early medial compartment degenerative changes. No acute
abnormalities.

## 2022-10-13 ENCOUNTER — Encounter: Payer: Self-pay | Admitting: Podiatry

## 2022-10-13 ENCOUNTER — Ambulatory Visit (INDEPENDENT_AMBULATORY_CARE_PROVIDER_SITE_OTHER): Payer: Medicare HMO

## 2022-10-13 ENCOUNTER — Ambulatory Visit (INDEPENDENT_AMBULATORY_CARE_PROVIDER_SITE_OTHER): Payer: Medicare HMO | Admitting: Podiatry

## 2022-10-13 VITALS — BP 146/95 | HR 77

## 2022-10-13 DIAGNOSIS — M7662 Achilles tendinitis, left leg: Secondary | ICD-10-CM | POA: Diagnosis not present

## 2022-10-13 DIAGNOSIS — M7732 Calcaneal spur, left foot: Secondary | ICD-10-CM

## 2022-10-13 DIAGNOSIS — M2042 Other hammer toe(s) (acquired), left foot: Secondary | ICD-10-CM | POA: Diagnosis not present

## 2022-10-13 NOTE — Progress Notes (Signed)
Chief Complaint  Patient presents with   Foot Pain    "I have a bone spur on my heel." N - pain heel L - posterior heel left D - about 1 year O - gradually worse C - sharp pain A - apply weight to it, exercising - walking T - inserts, apply topical pain medicine, Tramadol that I take for my back    HPI: 57 y.o. female PMHx T2DM; controlled presenting today as a new patient for evaluation of chronic heel pain ongoing for over 1 year now.  Patient has been treated and managed by other physicians including her PCP and orthopedist regarding her left heel pain.  Gradual idiopathic onset over 1 year ago now.  Patient states that she has tried stretching exercises, physical therapy exercises, anti-inflammatory medications, topical pain medications, orthotics, and shoe gear modifications with no lasting alleviation of her symptoms.  She continues to have pain and tenderness to the posterior heel and it is affecting her daily quality of life.  Past Medical History:  Diagnosis Date   Borderline diabetic    Diabetes mellitus without complication (HCC)    High cholesterol    Hypertension     Past Surgical History:  Procedure Laterality Date   CESAREAN SECTION      Allergies  Allergen Reactions   Lisinopril Other (See Comments)    Pt unsure states that she was told not to take it the last time she was here.      Physical Exam: General: The patient is alert and oriented x3 in no acute distress.  Dermatology: Skin is warm, dry and supple bilateral lower extremities. Negative for open lesions or macerations.  Vascular: Palpable pedal pulses bilaterally. No edema or erythema noted. Capillary refill within normal limits.  Neurological: Grossly intact via light touch  Musculoskeletal Exam: Pain on palpation noted to the posterior tubercle of the left calcaneus at the insertion of the Achilles tendon consistent with retrocalcaneal bursitis. Range of motion within normal limits. Muscle  strength 5/5 in all muscle groups bilateral lower extremities.  Radiographic Exam LT foot 10/13/2022:  Posterior and plantar calcaneal spur noted to the respective calcaneus on lateral view. No fracture or dislocation noted. Normal osseous mineralization noted.     Assessment: 1. Insertional Achilles tendinitis left 2.  Posterior and plantar heel spur left  Plan of Care:  -Patient was evaluated. Radiographs were reviewed today. -After discussing with the patient regarding her past medical history she is very frustrated because of the pain that she experiences on a daily basis.  She has tried multiple conservative treatment modalities with little to no relief.  She now has pain and tenderness on a daily basis despite efforts and management by her other physicians to alleviate her symptoms.  She would like to discuss surgery to permanently correct for her posterior heel spur and pain. -We did discuss additional modalities including PRP and shockwave however the patient ultimately declined.  These modalities are not covered by insurance and varying results.  I do believe that it is appropriate at this time to discuss possible surgery to resect the posterior heel spur and repair of the Achilles tendon.  Risk benefits advantages and disadvantages of the procedure were explained in detail to the patient.  Postoperative recovery course was also explained in detail.  She understands that she will be strictly nonweightbearing for minimum 6 weeks postoperatively.  All patient questions were answered.  No guarantees were expressed or implied.  After discussing with the  patient she would like to pursue the surgical option -Authorization for surgery was initiated today.  Surgery will consist of posterior heel spur resection with repair of Achilles tendon left -Return to clinic 1 week postop   Felecia Shelling, DPM [facility]  Dr. Felecia Shelling, DPM    [address] [place], [place] [address]                Office [phone]  Fax [phone]

## 2022-10-29 ENCOUNTER — Other Ambulatory Visit: Payer: Self-pay | Admitting: Podiatry

## 2022-10-29 DIAGNOSIS — M7662 Achilles tendinitis, left leg: Secondary | ICD-10-CM | POA: Diagnosis not present

## 2022-10-29 DIAGNOSIS — M7732 Calcaneal spur, left foot: Secondary | ICD-10-CM | POA: Diagnosis not present

## 2022-10-29 MED ORDER — OXYCODONE-ACETAMINOPHEN 5-325 MG PO TABS: 1.0000 | ORAL_TABLET | ORAL | 0 refills | Status: AC | PRN

## 2022-10-29 MED ORDER — IBUPROFEN 800 MG PO TABS: 800.0000 mg | ORAL_TABLET | Freq: Three times a day (TID) | ORAL | 1 refills | Status: AC

## 2022-10-29 NOTE — Progress Notes (Signed)
PRN postop 

## 2022-11-06 ENCOUNTER — Ambulatory Visit: Payer: Medicare HMO | Admitting: Podiatry

## 2022-11-06 ENCOUNTER — Ambulatory Visit: Payer: Medicare HMO

## 2022-11-06 DIAGNOSIS — M7732 Calcaneal spur, left foot: Secondary | ICD-10-CM

## 2022-11-06 NOTE — Progress Notes (Signed)
   Chief Complaint  Patient presents with   Heel Spurs    POV # 1 DOS 10/29/2022 --- POSTERIOR HEEL SPUR RESECTION LEFT, REPAIR OF ACHILLES LEFT   Routine Post Op    Subjective:  Patient presents today status post posterior heel spur resection w/ repair of achilles left. DOS: 10/29/2022. Patient doing well. She is NWB in the fiberglass cast using a knee scooter.  No pain.  Doing very well  Past Medical History:  Diagnosis Date   Borderline diabetic    Diabetes mellitus without complication (HCC)    High cholesterol    Hypertension     Past Surgical History:  Procedure Laterality Date   CESAREAN SECTION      Allergies  Allergen Reactions   Lisinopril Other (See Comments)    Pt unsure states that she was told not to take it the last time she was here.     Objective/Physical Exam Fiberglass cast left intact today.  Capillary refill WNL.  Patient able to move her toes.  No irritation from the cast.  No pain.  Radiographic Exam LT foot 11/06/2022:  Interval resection of the posterior heel spur noted.    Assessment: 1. s/p posterior heel spur resection with repair of Achilles tendon left. DOS: 10/29/2022   Plan of Care:  -Patient was evaluated. X-rays reviewed - Cast left intact today. -Continue NWB in the fiberglass cast with the knee scooter -Return to clinic 3 weeks for cast removal and possible suture removal   Felecia Shelling, DPM [facility]  Dr. Felecia Shelling, DPM    [address] [place], [place] [address]                Office [phone]  Fax [phone]

## 2022-11-13 ENCOUNTER — Encounter: Payer: Medicare HMO | Admitting: Podiatry

## 2022-11-27 ENCOUNTER — Ambulatory Visit (INDEPENDENT_AMBULATORY_CARE_PROVIDER_SITE_OTHER): Payer: Medicare HMO | Admitting: Podiatry

## 2022-11-27 ENCOUNTER — Ambulatory Visit (INDEPENDENT_AMBULATORY_CARE_PROVIDER_SITE_OTHER): Payer: Medicare HMO

## 2022-11-27 DIAGNOSIS — M7732 Calcaneal spur, left foot: Secondary | ICD-10-CM | POA: Diagnosis not present

## 2022-11-27 NOTE — Progress Notes (Signed)
   Chief Complaint  Patient presents with   Routine Post Op    POV # 3 DOS 10/29/2022 --- POSTERIOR HEEL SPUR RESECTION LEFT, REPAIR OF ACHILLES LEFT    Subjective:  Patient presents today status post posterior heel spur resection w/ repair of achilles left. DOS: 10/29/2022. Patient doing well. She is NWB in the fiberglass cast using a knee scooter.  No pain.  Doing very well  Past Medical History:  Diagnosis Date   Borderline diabetic    Diabetes mellitus without complication (HCC)    High cholesterol    Hypertension     Past Surgical History:  Procedure Laterality Date   CESAREAN SECTION      Allergies  Allergen Reactions   Lisinopril Other (See Comments)    Pt unsure states that she was told not to take it the last time she was here.     Objective/Physical Exam Neurovascular status intact.  The posterior heel incision is healing nicely.  Sutures are intact.  No dehiscence or drainage.  No erythema or edema.  Overall well-healing surgical foot  Radiographic Exam LT foot 11/06/2022:  Interval resection of the posterior heel spur noted.    Assessment: 1. s/p posterior heel spur resection with repair of Achilles tendon left. DOS: 10/29/2022   Plan of Care:  -Patient was evaluated.  -The below-knee fiberglass cast was bivalved and removed today.  Sutures removed. -Cam boot dispensed.  Patient may begin PWB in the cam boot.  A heel lift was also applied within the cam boot to decrease tension from the Achilles -Patient may also begin passive range of motion exercises to the ankle which were demonstrated today -Return to clinic 4 weeks follow-up x-ray and possible referral to physical therapy  Felecia Shelling, DPM [facility]  Dr. Felecia Shelling, DPM    [address] [place], [place] [address]                Office [phone]  Fax [phone]

## 2022-12-25 ENCOUNTER — Encounter: Payer: Self-pay | Admitting: Podiatry

## 2022-12-25 ENCOUNTER — Ambulatory Visit (INDEPENDENT_AMBULATORY_CARE_PROVIDER_SITE_OTHER): Payer: Medicare HMO | Admitting: Podiatry

## 2022-12-25 VITALS — BP 120/88 | HR 76 | Temp 97.0°F | Resp 20 | Ht 63.0 in | Wt 204.0 lb

## 2022-12-25 DIAGNOSIS — E119 Type 2 diabetes mellitus without complications: Secondary | ICD-10-CM

## 2022-12-25 DIAGNOSIS — M7662 Achilles tendinitis, left leg: Secondary | ICD-10-CM

## 2022-12-25 NOTE — Progress Notes (Signed)
   Chief Complaint  Patient presents with   Routine Post Op    RM8: POV # 4 DOS 10/29/2022 --- POSTERIOR HEEL SPUR RESECTION LEFT, REPAIR OF ACHILLES LEFT     Subjective:  Patient presents today status post posterior heel spur resection w/ repair of achilles left. DOS: 10/29/2022.  Patient continues to do very well.  WBAT in the cam boot.  No new complaints.  Past Medical History:  Diagnosis Date   Borderline diabetic    Diabetes mellitus without complication (HCC)    High cholesterol    Hypertension     Past Surgical History:  Procedure Laterality Date   CESAREAN SECTION      Allergies  Allergen Reactions   Lisinopril Other (See Comments)    Pt unsure states that she was told not to take it the last time she was here.     Objective/Physical Exam Neurovascular status intact.  The posterior heel incision is healing nicely.   No erythema or edema.  Overall well-healing surgical foot  Light touch and protective threshold intact.  No tenderness with palpation.  Muscle strength 5/5 all compartments.  Normal ROM pedal and ankle joints bilateral.  No pain with ambulation  Radiographic Exam LT foot 11/06/2022:  Interval resection of the posterior heel spur noted.    Assessment: 1. s/p posterior heel spur resection with repair of Achilles tendon left. DOS: 10/29/2022 2.  Encounter for diabetic foot exam   Plan of Care:  -Patient was evaluated.  Comprehensive diabetic foot exam performed today - Patient may now transition out of the cam boot into good supportive tennis shoes and sneakers -Physical therapy was discussed and offered with the patient.  Ultimately she declined -Recommend daily range of motion exercises and light stretching exercises which were demonstrated today -Return to clinic 8 weeks  Felecia Shelling, DPM [facility]  Dr. Felecia Shelling, DPM    [address] [place], [place] [address]                Office [phone]  Fax [phone]

## 2023-02-12 ENCOUNTER — Ambulatory Visit (INDEPENDENT_AMBULATORY_CARE_PROVIDER_SITE_OTHER): Payer: Medicare HMO | Admitting: Podiatry

## 2023-02-12 ENCOUNTER — Encounter: Payer: Self-pay | Admitting: Podiatry

## 2023-02-12 DIAGNOSIS — M7662 Achilles tendinitis, left leg: Secondary | ICD-10-CM | POA: Diagnosis not present

## 2023-02-12 NOTE — Progress Notes (Signed)
   Chief Complaint  Patient presents with   Routine Post Op    POV DOS 10/29/2022 --- POSTERIOR HEEL SPUR RESECTION LEFT, REPAIR OF ACHILLES LEFT "It's good."    Subjective:  Patient presents today status post posterior heel spur resection w/ repair of achilles left. DOS: 10/29/2022.  Patient is doing very well.  She has no pain.  She actually went dancing for her birthday and she is very satisfied with her surgery.  Past Medical History:  Diagnosis Date   Borderline diabetic    Diabetes mellitus without complication (HCC)    High cholesterol    Hypertension     Past Surgical History:  Procedure Laterality Date   CESAREAN SECTION      Allergies  Allergen Reactions   Lisinopril Other (See Comments)    Pt unsure states that she was told not to take it the last time she was here.     Objective/Physical Exam Neurovascular status intact.  Posterior incision nicely healed.  No edema or erythema.  Muscle strength 5/5 all compartments.  There is no pain with palpation to the posterior heel or range of motion  Radiographic Exam LT foot 11/06/2022:  Interval resection of the posterior heel spur noted.    Assessment: 1. s/p posterior heel spur resection with repair of Achilles tendon left. DOS: 10/29/2022  Plan of Care:  -Patient was evaluated.   -Patient is doing well and basically full activity with no restrictions.  She actually went dancing for her birthday with no pain -Continue wearing good supportive tennis shoes and sneakers -Return to clinic as needed  Felecia Shelling, DPM [facility]  Dr. Felecia Shelling, DPM    [address] [place], [place] [address]                Office [phone]  Fax [phone]
